# Patient Record
Sex: Female | Born: 1950
Health system: Southern US, Community
[De-identification: ages and names within clinical notes are randomized; demographics above are authoritative.]

## PROBLEM LIST (undated history)

## (undated) DIAGNOSIS — E785 Hyperlipidemia, unspecified: Secondary | ICD-10-CM

## (undated) HISTORY — PX: EVALUATION UNDER ANESTHESIA WITH HEMORRHOIDECTOMY AND PROCTOSCOPY: SHX5625

## (undated) HISTORY — DX: Hyperlipidemia, unspecified: E78.5

## (undated) HISTORY — PX: COLONOSCOPY WITH PROPOFOL: SHX5780

---

## 2005-03-17 ENCOUNTER — Ambulatory Visit: Payer: Self-pay | Admitting: Obstetrics and Gynecology

## 2006-04-28 ENCOUNTER — Ambulatory Visit: Payer: Self-pay | Admitting: Obstetrics and Gynecology

## 2010-07-08 ENCOUNTER — Ambulatory Visit: Payer: Self-pay | Admitting: Family Medicine

## 2011-07-14 ENCOUNTER — Ambulatory Visit: Payer: Self-pay | Admitting: Family Medicine

## 2011-08-10 ENCOUNTER — Ambulatory Visit: Payer: Self-pay | Admitting: Unknown Physician Specialty

## 2011-08-10 LAB — HM COLONOSCOPY

## 2013-07-31 ENCOUNTER — Ambulatory Visit: Payer: Self-pay | Admitting: Family Medicine

## 2014-10-21 LAB — HEPATIC FUNCTION PANEL
ALK PHOS: 59 (ref 25–125)
ALT: 17 (ref 7–35)
AST: 25 (ref 13–35)
Bilirubin, Total: 0.4

## 2014-10-21 LAB — LIPID PANEL
CHOLESTEROL: 251 — AB (ref 0–200)
HDL: 74 — AB (ref 35–70)
LDL CALC: 163
TRIGLYCERIDES: 69 (ref 40–160)

## 2014-10-21 LAB — BASIC METABOLIC PANEL
BUN: 16 (ref 4–21)
CREATININE: 0.5 (ref 0.5–1.1)
Glucose: 109
Potassium: 4.8 (ref 3.4–5.3)
SODIUM: 143 (ref 137–147)

## 2014-10-21 LAB — TSH: TSH: 3.27 (ref 0.41–5.90)

## 2015-12-23 ENCOUNTER — Other Ambulatory Visit: Payer: Self-pay | Admitting: Family Medicine

## 2015-12-23 DIAGNOSIS — Z1231 Encounter for screening mammogram for malignant neoplasm of breast: Secondary | ICD-10-CM

## 2016-01-07 ENCOUNTER — Encounter: Payer: Self-pay | Admitting: Radiology

## 2016-01-07 ENCOUNTER — Ambulatory Visit
Admission: RE | Admit: 2016-01-07 | Discharge: 2016-01-07 | Disposition: A | Discharge status: Home / Self Care | Payer: PRIVATE HEALTH INSURANCE | Source: Ambulatory Visit | Attending: Family Medicine | Admitting: Family Medicine

## 2016-01-07 DIAGNOSIS — Z1231 Encounter for screening mammogram for malignant neoplasm of breast: Secondary | ICD-10-CM | POA: Insufficient documentation

## 2016-09-08 ENCOUNTER — Encounter: Payer: Self-pay | Admitting: Podiatry

## 2016-09-08 ENCOUNTER — Ambulatory Visit (INDEPENDENT_AMBULATORY_CARE_PROVIDER_SITE_OTHER): Payer: PRIVATE HEALTH INSURANCE | Admitting: Podiatry

## 2016-09-08 ENCOUNTER — Ambulatory Visit (INDEPENDENT_AMBULATORY_CARE_PROVIDER_SITE_OTHER): Payer: PRIVATE HEALTH INSURANCE

## 2016-09-08 VITALS — BP 139/90 | HR 62 | Resp 16

## 2016-09-08 DIAGNOSIS — Q828 Other specified congenital malformations of skin: Secondary | ICD-10-CM

## 2016-09-08 DIAGNOSIS — M898X9 Other specified disorders of bone, unspecified site: Secondary | ICD-10-CM

## 2016-09-08 DIAGNOSIS — E782 Mixed hyperlipidemia: Secondary | ICD-10-CM | POA: Insufficient documentation

## 2016-09-08 DIAGNOSIS — Z709 Sex counseling, unspecified: Secondary | ICD-10-CM | POA: Insufficient documentation

## 2016-09-08 DIAGNOSIS — M791 Myalgia, unspecified site: Secondary | ICD-10-CM | POA: Insufficient documentation

## 2016-09-08 DIAGNOSIS — M2041 Other hammer toe(s) (acquired), right foot: Secondary | ICD-10-CM

## 2016-09-08 DIAGNOSIS — M2042 Other hammer toe(s) (acquired), left foot: Secondary | ICD-10-CM

## 2016-09-08 DIAGNOSIS — E785 Hyperlipidemia, unspecified: Secondary | ICD-10-CM | POA: Insufficient documentation

## 2016-09-08 DIAGNOSIS — K6289 Other specified diseases of anus and rectum: Secondary | ICD-10-CM | POA: Insufficient documentation

## 2016-09-08 NOTE — Progress Notes (Signed)
   Subjective:    Patient ID: Amanda Love, female    DOB: November 26, 1950, 66 y.o.   MRN: [id number]  HPI: She presents today with chief complaint of pain to the fifth digit of the left foot. She states that she's having some tenderness with the digital the right foot as well as some changes about the fourth digits bilaterally. She states that she has a small sore on her fifth toe that is painful. Stasis then there for several weeks there is recently gotten worse.    Review of Systems  All other systems reviewed and are negative.      Objective:   Physical Exam: I'll signs are stable alert and oriented 3. Pulses are palpable. Neurologic sensorium is intact. Deep tendon reflexes are intact. Muscle strength is 5 over 5 dorsiflexion plantar flexors and inverters everters all intrinsic musculature is intact bilateral. Orthopedic evaluation is resolved with assistive ankle for range of motion or crepitation. She has adductovarus rotated hammertoe deformities #4 #5 in particular bilaterally. Radiographs taken today do demonstrate adductovarus rotated hammertoe deformity #4 #5 of the left foot. No open lesions or wounds that she does demonstrate an overlying reactive hyperkeratosis medial aspect of the fifth DIPJ.        Assessment & Plan:  Porokeratosis with hammertoe deformities fifth left.  Plan: I debrided the lesion for her today and discussed surgical intervention regarding toes #4 #5 bilaterally. We will follow-up with her in the fall for this procedure. Also apply Cantharone under occlusion to be washed out thoroughly tomorrow as a chemical destruction. She will watch Faithlynn symptoms of infection notify me if there are any.

## 2017-03-29 LAB — HEPATIC FUNCTION PANEL
ALK PHOS: 62 (ref 25–125)
ALT: 28 (ref 7–35)
AST: 27 (ref 13–35)
Bilirubin, Total: 0.4

## 2017-03-29 LAB — BASIC METABOLIC PANEL
BUN: 12 (ref 4–21)
Creatinine: 0.6 (ref 0.5–1.1)
Glucose: 91
Potassium: 4.3 (ref 3.4–5.3)
Sodium: 142 (ref 137–147)

## 2017-03-29 LAB — CBC AND DIFFERENTIAL
HCT: 39 (ref 36–46)
HEMOGLOBIN: 12.9 (ref 12.0–16.0)
NEUTROS ABS: 33
PLATELETS: 294 (ref 150–399)
WBC: 3

## 2017-03-29 LAB — LIPID PANEL
CHOLESTEROL: 267 — AB (ref 0–200)
HDL: 65 (ref 35–70)
LDL CALC: 186
TRIGLYCERIDES: 80 (ref 40–160)

## 2017-03-29 LAB — HEMOGLOBIN A1C: HEMOGLOBIN A1C: 5.4

## 2017-03-29 LAB — TSH: TSH: 3.57 (ref 0.41–5.90)

## 2017-03-29 LAB — HM HEPATITIS C SCREENING LAB: HM Hepatitis Screen: NEGATIVE

## 2017-04-12 ENCOUNTER — Encounter: Payer: Self-pay | Admitting: Family Medicine

## 2017-04-12 ENCOUNTER — Ambulatory Visit (INDEPENDENT_AMBULATORY_CARE_PROVIDER_SITE_OTHER): Payer: PRIVATE HEALTH INSURANCE | Admitting: Family Medicine

## 2017-04-12 VITALS — BP 132/80 | HR 73 | Temp 97.9°F | Ht 63.0 in | Wt 120.6 lb

## 2017-04-12 DIAGNOSIS — E782 Mixed hyperlipidemia: Secondary | ICD-10-CM

## 2017-04-12 MED ORDER — PITAVASTATIN CALCIUM 2 MG PO TABS: 2.0000 mg | ORAL_TABLET | Freq: Every day | ORAL | 0 refills | Status: DC

## 2017-04-12 NOTE — Progress Notes (Signed)
Patient: Amanda Love Female    DOB: 1950-08-12   66 y.o.   MRN: [id number] Visit Date: 04/12/2017  Today's Provider: Vernie Murders, PA   Chief Complaint  Patient presents with  . Hyperlipidemia  . Follow-up   Subjective:    HPI  Lipid/Cholesterol, Follow-up:   Last seen for this over 2 years ago.  Management changes since that visit include patient taking Zetia. . Last Lipid Panel: Lab Results  Component Value Date   CHOL 267 (A) 03/29/2017   CHOL 251 (A) 10/21/2014   Lab Results  Component Value Date   HDL 65 03/29/2017   HDL 74 (A) 10/21/2014   Lab Results  Component Value Date   LDLCALC 186 03/29/2017   LDLCALC 163 10/21/2014   Lab Results  Component Value Date   TRIG 80 03/29/2017   TRIG 69 10/21/2014   She reports poor compliance with treatment. Patient states she can not take Lipitor or Zetia due to muscle aches. She is not having side effects.  Current symptoms include none  Weight trend: stable   Wt Readings from Last 3 Encounters:  04/12/17 120 lb 9.6 oz (54.7 kg)    -------------------------------------------------------------------  History reviewed. No pertinent past medical history.  History reviewed. No pertinent surgical history.  History reviewed. No pertinent family history.   No Known Allergies  Current Outpatient Medications:  .  ASCORBIC ACID PO, Take by mouth., Disp: , Rfl:  .  ASPIRIN 81 PO, Take by mouth., Disp: , Rfl:  .  CALCIUM CARBONATE-VITAMIN D PO, Take by mouth., Disp: , Rfl:  .  CYANOCOBALAMIN PO, Take by mouth., Disp: , Rfl:  .  ezetimibe (ZETIA) 10 MG tablet, Take by mouth., Disp: , Rfl:   Review of Systems  Constitutional: Negative.   Respiratory: Negative.   Cardiovascular: Negative.   Musculoskeletal: Negative.    Social History   Tobacco Use  . Smoking status: Never Smoker  . Smokeless tobacco: Never Used  Substance Use Topics  . Alcohol use: No   Objective:   BP 132/80 (BP Location: Right Arm,  Patient Position: Sitting, Cuff Size: Normal)   Pulse 73   Temp 97.9 F (36.6 C) (Oral)   Ht 5\' 3"  (1.6 m)   Wt 120 lb 9.6 oz (54.7 kg)   SpO2 97%   BMI 21.36 kg/m    Physical Exam  Constitutional: She is oriented to person, place, and time. She appears well-developed and well-nourished. No distress.  HENT:  Head: Normocephalic and atraumatic.  Right Ear: Hearing normal.  Left Ear: Hearing normal.  Nose: Nose normal.  Eyes: Conjunctivae and lids are normal. Right eye exhibits no discharge. Left eye exhibits no discharge. No scleral icterus.  Neck: Neck supple.  Cardiovascular: Normal rate and regular rhythm.  Pulmonary/Chest: Effort normal and breath sounds normal. No respiratory distress.  Abdominal: Soft. Bowel sounds are normal.  Musculoskeletal: Normal range of motion.  Neurological: She is alert and oriented to person, place, and time.  Skin: Skin is intact. No lesion and no rash noted.  Psychiatric: She has a normal mood and affect. Her speech is normal and behavior is normal. Thought content normal.      Assessment & Plan:     1. Mixed hyperlipidemia States she stopped the Zetia after using it for a month because of muscle aches. Trying to keep fats low in diet. On 03-29-17, lipid test at [facility] LDL was 186, HDL 65 and total cholesterol 267. Will give trial of  Livalo 2 mg qd. Take Co-Q 10 qd with the Livalo and call if any side effects in 2 weeks. May need to consider Welchol if unable to take this. - Pitavastatin Calcium 2 MG TABS; Take 1 tablet (2 mg total) daily after supper by mouth.  Dispense: 14 tablet; Refill: [place], PA  [facility]

## 2017-04-25 ENCOUNTER — Other Ambulatory Visit: Payer: Self-pay

## 2017-04-25 DIAGNOSIS — E782 Mixed hyperlipidemia: Secondary | ICD-10-CM

## 2017-04-25 MED ORDER — PITAVASTATIN CALCIUM 2 MG PO TABS: 2.0000 mg | ORAL_TABLET | Freq: Every day | ORAL | 3 refills | Status: DC

## 2017-04-25 NOTE — Telephone Encounter (Signed)
Pitavastatin 2 mg refill sent to [facility] per Simona Huh. Patient states she is only having minimal muscle aches with medication and can deal with the aches at this time. She states she will call back if symptoms worsen. 3 month follow up scheduled.

## 2017-04-27 ENCOUNTER — Telehealth: Payer: Self-pay | Admitting: Family Medicine

## 2017-04-27 NOTE — Telephone Encounter (Signed)
Pt states the Rx Pitavastatin Calcium 2 MG TABS  is going to cost $379.00 a month.  Pt is stating this is too expensive and pt is requesting something different.  [place]  JK#[phone]

## 2017-04-27 NOTE — Telephone Encounter (Signed)
PA submitted through cover my meds on 04/26/17 waiting on response.

## 2017-05-05 NOTE — Telephone Encounter (Signed)
Patient advised. Patient agrees to try Crestor. Pharmacy-[facility]

## 2017-05-05 NOTE — Telephone Encounter (Signed)
LMTCB, PA for Livalo denied because patient has not tried Crestor 40 mg and Lipitor 80 mg. Wanted to se if patient wants to switch to Crestor 40 mg.

## 2017-05-06 ENCOUNTER — Other Ambulatory Visit: Payer: Self-pay | Admitting: Family Medicine

## 2017-05-06 ENCOUNTER — Telehealth: Payer: Self-pay | Admitting: Family Medicine

## 2017-05-06 DIAGNOSIS — E782 Mixed hyperlipidemia: Secondary | ICD-10-CM

## 2017-05-06 MED ORDER — ROSUVASTATIN CALCIUM 5 MG PO TABS: 5.0000 mg | ORAL_TABLET | Freq: Every day | ORAL | 3 refills | Status: DC

## 2017-05-06 MED ORDER — EZETIMIBE 10 MG PO TABS: 10.0000 mg | ORAL_TABLET | Freq: Every day | ORAL | 3 refills | Status: DC

## 2017-05-06 NOTE — Telephone Encounter (Signed)
Please advise 

## 2017-05-06 NOTE — Telephone Encounter (Signed)
Done

## 2017-05-06 NOTE — Telephone Encounter (Signed)
Will start at a lower dose instead of at maximum level to be sure she tolerates this. Sent to the [facility].

## 2017-05-06 NOTE — Telephone Encounter (Signed)
Pt stated that she was advised her insurance will not cover rosuvastatin (CRESTOR) 5 MG tablet and pt is requesting an new Rx for ezetimibe (ZETIA) 10 MG tablet to [facility]. Please advise. Thanks TNP

## 2017-05-06 NOTE — Telephone Encounter (Signed)
Patient advised RX would be sent today to [facility]. She will call back if she has any side effects.

## 2017-06-01 ENCOUNTER — Telehealth: Payer: Self-pay | Admitting: Family Medicine

## 2017-06-01 NOTE — Telephone Encounter (Signed)
Patient states that her insurance has changed as on 05/31/2017 and will now cover the generic Crestor (Rosuvastatin).  They also gave her the name lovastatin that they will cover.  She would like to switch back to this medication.  Please call patient and let her know.  She uses [facility].

## 2017-06-02 ENCOUNTER — Other Ambulatory Visit: Payer: Self-pay | Admitting: Family Medicine

## 2017-06-02 DIAGNOSIS — E782 Mixed hyperlipidemia: Secondary | ICD-10-CM

## 2017-06-02 MED ORDER — ROSUVASTATIN CALCIUM 5 MG PO TABS: 5.0000 mg | ORAL_TABLET | Freq: Every day | ORAL | 1 refills | Status: DC

## 2017-06-02 NOTE — Telephone Encounter (Signed)
Will send in prescription for 5 mg (generic) qd to [facility]. Should recheck labs, to assess response, in 3 months.

## 2017-06-02 NOTE — Telephone Encounter (Signed)
Which - lovastatin or rosuvastatin?

## 2017-06-02 NOTE — Telephone Encounter (Signed)
Patient states she now has Medicare and would like the Crestor 5 mg resent to [facility]. She contacted Medicare and they will pay for medication.

## 2017-06-02 NOTE — Telephone Encounter (Signed)
Patient advised. Follow up scheduled.  

## 2017-06-02 NOTE — Progress Notes (Signed)
Change in health insurance to medicare. Now has coverage for the Crestor. Will send in prescription for 5 mg (generic) qd to [facility]. Should recheck labs, to assess response, in 3 months.

## 2017-06-03 DIAGNOSIS — Z961 Presence of intraocular lens: Secondary | ICD-10-CM | POA: Diagnosis not present

## 2017-06-28 DIAGNOSIS — M9904 Segmental and somatic dysfunction of sacral region: Secondary | ICD-10-CM | POA: Diagnosis not present

## 2017-06-28 DIAGNOSIS — M9903 Segmental and somatic dysfunction of lumbar region: Secondary | ICD-10-CM | POA: Diagnosis not present

## 2017-06-28 DIAGNOSIS — M545 Low back pain: Secondary | ICD-10-CM | POA: Diagnosis not present

## 2017-06-28 DIAGNOSIS — M7918 Myalgia, other site: Secondary | ICD-10-CM | POA: Diagnosis not present

## 2017-07-01 DIAGNOSIS — M9903 Segmental and somatic dysfunction of lumbar region: Secondary | ICD-10-CM | POA: Diagnosis not present

## 2017-07-01 DIAGNOSIS — M9904 Segmental and somatic dysfunction of sacral region: Secondary | ICD-10-CM | POA: Diagnosis not present

## 2017-07-01 DIAGNOSIS — M7918 Myalgia, other site: Secondary | ICD-10-CM | POA: Diagnosis not present

## 2017-07-01 DIAGNOSIS — M545 Low back pain: Secondary | ICD-10-CM | POA: Diagnosis not present

## 2017-07-05 DIAGNOSIS — M9903 Segmental and somatic dysfunction of lumbar region: Secondary | ICD-10-CM | POA: Diagnosis not present

## 2017-07-05 DIAGNOSIS — M9904 Segmental and somatic dysfunction of sacral region: Secondary | ICD-10-CM | POA: Diagnosis not present

## 2017-07-05 DIAGNOSIS — M7918 Myalgia, other site: Secondary | ICD-10-CM | POA: Diagnosis not present

## 2017-07-05 DIAGNOSIS — M545 Low back pain: Secondary | ICD-10-CM | POA: Diagnosis not present

## 2017-07-26 ENCOUNTER — Ambulatory Visit: Payer: PRIVATE HEALTH INSURANCE | Admitting: Family Medicine

## 2017-08-30 ENCOUNTER — Encounter: Payer: Self-pay | Admitting: Family Medicine

## 2017-08-30 ENCOUNTER — Ambulatory Visit (INDEPENDENT_AMBULATORY_CARE_PROVIDER_SITE_OTHER): Payer: PPO | Admitting: Family Medicine

## 2017-08-30 ENCOUNTER — Ambulatory Visit: Payer: PPO | Admitting: Family Medicine

## 2017-08-30 VITALS — BP 112/66 | HR 71 | Temp 97.7°F | Wt 120.8 lb

## 2017-08-30 DIAGNOSIS — M65332 Trigger finger, left middle finger: Secondary | ICD-10-CM | POA: Diagnosis not present

## 2017-08-30 DIAGNOSIS — E782 Mixed hyperlipidemia: Secondary | ICD-10-CM

## 2017-08-30 NOTE — Progress Notes (Signed)
     Patient: Amanda Love Female    DOB: 09-May-1951   67 y.o.   MRN: [id number] Visit Date: 08/30/2017  Today's Provider: Vernie Murders, PA   Chief Complaint  Patient presents with  . Hyperlipidemia  . Follow-up   Subjective:    HPI  Lipid/Cholesterol, Follow-up:   Last seen for this 5 months ago Management changes since that visit include patient started Crestor. Her insurance changed and medication was then covered.  Last Lipid Panel: Lab Results  Component Value Date   CHOL 267 (A) 03/29/2017   HDL 65 03/29/2017   LDLCALC 186 03/29/2017   TRIG 80 03/29/2017   She reports good compliance with treatment.  She is not having side effects.  Current symptoms include: none  Weight trend: stable  Wt Readings from Last 3 Encounters:  08/30/17 120 lb 12.8 oz (54.8 kg)  04/12/17 120 lb 9.6 oz (54.7 kg)   No Known Allergies  Current Outpatient Medications:  .  ASCORBIC ACID PO, Take by mouth., Disp: , Rfl:  .  ASPIRIN 81 PO, Take by mouth., Disp: , Rfl:  .  CALCIUM CARBONATE-VITAMIN D PO, Take by mouth., Disp: , Rfl:  .  CYANOCOBALAMIN PO, Take by mouth., Disp: , Rfl:  .  rosuvastatin (CRESTOR) 5 MG tablet, Take 1 tablet (5 mg total) by mouth daily., Disp: 90 tablet, Rfl: 1  Review of Systems  Constitutional: Negative.   Respiratory: Negative.   Cardiovascular: Negative.     Social History   Tobacco Use  . Smoking status: Never Smoker  . Smokeless tobacco: Never Used  Substance Use Topics  . Alcohol use: No   Objective:   BP 112/66 (BP Location: Right Arm, Patient Position: Sitting, Cuff Size: Normal)   Pulse 71   Temp 97.7 F (36.5 C) (Oral)   Wt 120 lb 12.8 oz (54.8 kg)   SpO2 99%   BMI 21.40 kg/m   Physical Exam  Constitutional: She is oriented to person, place, and time. She appears well-developed and well-nourished. No distress.  HENT:  Head: Normocephalic and atraumatic.  Right Ear: Hearing normal.  Left Ear: Hearing normal.  Nose: Nose  normal.  Eyes: Conjunctivae and lids are normal. Right eye exhibits no discharge. Left eye exhibits no discharge. No scleral icterus.  Neck: Neck supple.  Cardiovascular: Normal rate.  Pulmonary/Chest: Effort normal and breath sounds normal. No respiratory distress.  Abdominal: Soft. Bowel sounds are normal.  Musculoskeletal: Normal range of motion.  Neurological: She is alert and oriented to person, place, and time.  Skin: Skin is intact. No lesion and no rash noted.  Psychiatric: She has a normal mood and affect. Her speech is normal and behavior is normal. Thought content normal.      Assessment & Plan:     1. Mixed hyperlipidemia Tolerating Crestor 5 mg qd without side effects. Following a high fiber low fat diet. Continue exercise program 3-4 days a week. Recheck labs and follow up pending reports. - Lipid Profile - Comprehensive metabolic panel       Vernie Murders, PA  [facility]

## 2017-08-31 LAB — COMPREHENSIVE METABOLIC PANEL
A/G RATIO: 1.8 (ref 1.2–2.2)
ALT: 24 IU/L (ref 0–32)
AST: 26 IU/L (ref 0–40)
Albumin: 4.8 g/dL (ref 3.6–4.8)
Alkaline Phosphatase: 68 IU/L (ref 39–117)
BUN/Creatinine Ratio: 29 — ABNORMAL HIGH (ref 12–28)
BUN: 17 mg/dL (ref 8–27)
Bilirubin Total: 0.5 mg/dL (ref 0.0–1.2)
CALCIUM: 9.7 mg/dL (ref 8.7–10.3)
CO2: 24 mmol/L (ref 20–29)
Chloride: 102 mmol/L (ref 96–106)
Creatinine, Ser: 0.59 mg/dL (ref 0.57–1.00)
GFR, EST AFRICAN AMERICAN: 109 mL/min/{1.73_m2} (ref >59)
GFR, EST NON AFRICAN AMERICAN: 94 mL/min/{1.73_m2} (ref >59)
GLUCOSE: 91 mg/dL (ref 65–99)
Globulin, Total: 2.7 g/dL (ref 1.5–4.5)
Potassium: 4.3 mmol/L (ref 3.5–5.2)
Sodium: 141 mmol/L (ref 134–144)
TOTAL PROTEIN: 7.5 g/dL (ref 6.0–8.5)

## 2017-08-31 LAB — LIPID PANEL
CHOL/HDL RATIO: 3.1 ratio (ref 0.0–4.4)
Cholesterol, Total: 202 mg/dL — ABNORMAL HIGH (ref 100–199)
HDL: 65 mg/dL (ref >39)
LDL Calculated: 130 mg/dL — ABNORMAL HIGH (ref 0–99)
TRIGLYCERIDES: 33 mg/dL (ref 0–149)
VLDL Cholesterol Cal: 7 mg/dL (ref 5–40)

## 2017-09-01 ENCOUNTER — Ambulatory Visit: Payer: PPO | Admitting: Family Medicine

## 2017-09-02 ENCOUNTER — Telehealth: Payer: Self-pay

## 2017-09-02 NOTE — Telephone Encounter (Signed)
-----   Message from Margo Common, [place] sent at 09/01/2017 12:55 PM EDT ----- LDL cholesterol improving on the Crestor (rosuvastatin) 5 mg qd. Continue this dosage and recheck levels in 6 months. All blood chemistry (kidney function, liver function, blood sugar, etc.) normal.

## 2017-09-02 NOTE — Telephone Encounter (Signed)
LMTCB

## 2017-09-06 ENCOUNTER — Other Ambulatory Visit: Payer: Self-pay | Admitting: Family Medicine

## 2017-09-06 DIAGNOSIS — E782 Mixed hyperlipidemia: Secondary | ICD-10-CM

## 2017-09-06 MED ORDER — ROSUVASTATIN CALCIUM 5 MG PO TABS: 5.0000 mg | ORAL_TABLET | Freq: Every day | ORAL | 3 refills | Status: DC

## 2017-09-06 NOTE — Telephone Encounter (Signed)
Advised patient. Patient requesting a refill on Crestor 5mg  to be sent for 90 day supply at [facility] on [address]. Thanks!

## 2017-09-06 NOTE — Telephone Encounter (Signed)
Done

## 2017-09-27 DIAGNOSIS — M65332 Trigger finger, left middle finger: Secondary | ICD-10-CM | POA: Diagnosis not present

## 2017-12-06 DIAGNOSIS — M65332 Trigger finger, left middle finger: Secondary | ICD-10-CM | POA: Diagnosis not present

## 2017-12-16 DIAGNOSIS — M65332 Trigger finger, left middle finger: Secondary | ICD-10-CM | POA: Diagnosis not present

## 2018-04-14 ENCOUNTER — Ambulatory Visit
Admission: RE | Admit: 2018-04-14 | Discharge: 2018-04-14 | Disposition: A | Discharge status: Home / Self Care | Payer: PPO | Source: Ambulatory Visit | Attending: Family Medicine | Admitting: Family Medicine

## 2018-04-14 ENCOUNTER — Other Ambulatory Visit: Payer: Self-pay | Admitting: Family Medicine

## 2018-04-14 DIAGNOSIS — Z1231 Encounter for screening mammogram for malignant neoplasm of breast: Secondary | ICD-10-CM | POA: Diagnosis not present

## 2018-05-02 NOTE — Progress Notes (Signed)
       Patient: Amanda Love Female    DOB: [birth year]   67 y.o.   MRN: [id number] Visit Date: 05/03/2018  Today's Provider: Trinna Post, PA-C   Chief Complaint  Patient presents with  . Cough   Subjective:    Cough Patient presents today for cough for about 1 week . Patient denies any other symptoms at this time. She reports she has a dry cough that gets worse at night. She denies fevers, chills, sick contacts. Denies muscle aches. She does not smoke.      No Known Allergies   Current Outpatient Medications:  .  ASCORBIC ACID PO, Take by mouth., Disp: , Rfl:  .  ASPIRIN 81 PO, Take by mouth., Disp: , Rfl:  .  CALCIUM CARBONATE-VITAMIN D PO, Take by mouth., Disp: , Rfl:  .  CYANOCOBALAMIN PO, Take by mouth., Disp: , Rfl:  .  rosuvastatin (CRESTOR) 5 MG tablet, Take 1 tablet (5 mg total) by mouth daily., Disp: 90 tablet, Rfl: 3  Review of Systems  Respiratory: Positive for cough.     Social History   Tobacco Use  . Smoking status: Never Smoker  . Smokeless tobacco: Never Used  Substance Use Topics  . Alcohol use: No   Objective:   BP 116/72 (BP Location: Left Arm, Patient Position: Sitting, Cuff Size: Normal)   Pulse 74   Temp 98.1 F (36.7 C) (Oral)   Wt 122 lb 6.4 oz (55.5 kg)   SpO2 95%   BMI 21.68 kg/m  Vitals:   05/03/18 0954  BP: 116/72  Pulse: 74  Temp: 98.1 F (36.7 C)  TempSrc: Oral  SpO2: 95%  Weight: 122 lb 6.4 oz (55.5 kg)     Physical Exam  Constitutional: She is oriented to person, place, and time. She appears well-developed and well-nourished.  HENT:  Right Ear: Tympanic membrane, external ear and ear canal normal.  Left Ear: Tympanic membrane, external ear and ear canal normal.  Mouth/Throat: Uvula is midline and oropharynx is clear and moist. No oropharyngeal exudate.  Cardiovascular: Normal rate and regular rhythm.  Pulmonary/Chest: Effort normal and breath sounds normal.  Neurological: She is alert and oriented to person, place,  and time.  Skin: Skin is warm and dry.  Psychiatric: She has a normal mood and affect. Her behavior is normal.        Assessment & Plan:     1. Viral URI with cough  Counseled regarding signs and symptoms of viral and bacterial respiratory infections. Advised to call or return for additional evaluation if she develops any sign of bacterial infection, or if current symptoms last longer than 10 days.   - promethazine-dextromethorphan (PROMETHAZINE-DM) 6.25-15 MG/5ML syrup; Take 5 mLs by mouth 2 (two) times daily as needed for cough.  Dispense: 118 mL; Refill: 0  The entirety of the information documented in the History of Present Illness, Review of Systems and Physical Exam were personally obtained by me. Portions of this information were initially documented by Joseline ROsas, CMA and reviewed by me for thoroughness and accuracy.            Trinna Post, PA-C  [facility]

## 2018-05-03 ENCOUNTER — Encounter: Payer: Self-pay | Admitting: Physician Assistant

## 2018-05-03 ENCOUNTER — Ambulatory Visit (INDEPENDENT_AMBULATORY_CARE_PROVIDER_SITE_OTHER): Payer: PPO | Admitting: Physician Assistant

## 2018-05-03 VITALS — BP 116/72 | HR 74 | Temp 98.1°F | Wt 122.4 lb

## 2018-05-03 DIAGNOSIS — B9789 Other viral agents as the cause of diseases classified elsewhere: Secondary | ICD-10-CM

## 2018-05-03 DIAGNOSIS — J069 Acute upper respiratory infection, unspecified: Secondary | ICD-10-CM | POA: Diagnosis not present

## 2018-05-03 MED ORDER — PROMETHAZINE-DM 6.25-15 MG/5ML PO SYRP: 5.0000 mL | ORAL_SOLUTION | Freq: Two times a day (BID) | ORAL | 0 refills | Status: DC | PRN

## 2018-05-03 NOTE — Patient Instructions (Signed)
Upper Respiratory Infection, Adult Most upper respiratory infections (URIs) are caused by a virus. A URI affects the nose, throat, and upper air passages. The most common type of URI is often called "the common cold." Follow these instructions at home:  Take medicines only as told by your doctor.  Gargle warm saltwater or take cough drops to comfort your throat as told by your doctor.  Use a warm mist humidifier or inhale steam from a shower to increase air moisture. This may make it easier to breathe.  Drink enough fluid to keep your pee (urine) clear or pale yellow.  Eat soups and other clear broths.  Have a healthy diet.  Rest as needed.  Go back to work when your fever is gone or your doctor says it is okay. ? You may need to stay home longer to avoid giving your URI to others. ? You can also wear a face mask and wash your hands often to prevent spread of the virus.  Use your inhaler more if you have asthma.  Do not use any tobacco products, including cigarettes, chewing tobacco, or electronic cigarettes. If you need help quitting, ask your doctor. Contact a doctor if:  You are getting worse, not better.  Your symptoms are not helped by medicine.  You have chills.  You are getting more short of breath.  You have brown or red mucus.  You have yellow or brown discharge from your nose.  You have pain in your face, especially when you bend forward.  You have a fever.  You have puffy (swollen) neck glands.  You have pain while swallowing.  You have white areas in the back of your throat. Get help right away if:  You have very bad or constant: ? Headache. ? Ear pain. ? Pain in your forehead, behind your eyes, and over your cheekbones (sinus pain). ? Chest pain.  You have long-lasting (chronic) lung disease and any of the following: ? Wheezing. ? Long-lasting cough. ? Coughing up blood. ? A change in your usual mucus.  You have a stiff neck.  You have  changes in your: ? Vision. ? Hearing. ? Thinking. ? Mood. This information is not intended to replace advice given to you by your health care provider. Make sure you discuss any questions you have with your health care provider. Document Released: 11/03/2007 Document Revised: 01/18/2016 Document Reviewed: 08/22/2013 Elsevier Interactive Patient Education  2018 Elsevier Inc.  

## 2018-08-24 ENCOUNTER — Other Ambulatory Visit: Payer: Self-pay | Admitting: *Deleted

## 2018-08-24 DIAGNOSIS — E782 Mixed hyperlipidemia: Secondary | ICD-10-CM

## 2018-08-24 MED ORDER — ROSUVASTATIN CALCIUM 5 MG PO TABS: 5.0000 mg | ORAL_TABLET | Freq: Every day | ORAL | 0 refills | Status: DC

## 2018-08-28 ENCOUNTER — Ambulatory Visit: Payer: PPO | Admitting: Family Medicine

## 2018-10-26 ENCOUNTER — Other Ambulatory Visit: Payer: Self-pay

## 2018-10-26 ENCOUNTER — Encounter: Payer: Self-pay | Admitting: Family Medicine

## 2018-10-26 ENCOUNTER — Ambulatory Visit (INDEPENDENT_AMBULATORY_CARE_PROVIDER_SITE_OTHER): Payer: Medicare Other | Admitting: Family Medicine

## 2018-10-26 VITALS — BP 118/72 | HR 73 | Temp 98.1°F | Resp 16 | Wt 122.0 lb

## 2018-10-26 DIAGNOSIS — E782 Mixed hyperlipidemia: Secondary | ICD-10-CM

## 2018-10-26 DIAGNOSIS — Z719 Counseling, unspecified: Secondary | ICD-10-CM | POA: Diagnosis not present

## 2018-10-26 MED ORDER — ROSUVASTATIN CALCIUM 5 MG PO TABS: 5.0000 mg | ORAL_TABLET | Freq: Every day | ORAL | 3 refills | Status: DC

## 2018-10-26 NOTE — Progress Notes (Signed)
Patient: Amanda Love Female    DOB: Oct 28, 1950   68 y.o.   MRN: [id number] Visit Date: 10/26/2018  Today's Provider: Vernie Murders, PA   Chief Complaint  Patient presents with  . Hyperlipidemia   Subjective:     HPI   3 Month follow up for hyperlipidemia.    No past surgical history on file.   Family History  Problem Relation Age of Onset  . Breast cancer Neg Hx    Patient Active Problem List   Diagnosis Date Noted  . Anal burning 09/08/2016  . Sex counseling 09/08/2016  . HLD (hyperlipidemia) 09/08/2016  . Muscle ache 09/08/2016   No Known Allergies  Current Outpatient Medications:  .  ASCORBIC ACID PO, Take by mouth., Disp: , Rfl:  .  ASPIRIN 81 PO, Take by mouth., Disp: , Rfl:  .  CALCIUM CARBONATE-VITAMIN D PO, Take by mouth., Disp: , Rfl:  .  promethazine-dextromethorphan (PROMETHAZINE-DM) 6.25-15 MG/5ML syrup, Take 5 mLs by mouth 2 (two) times daily as needed for cough., Disp: 118 mL, Rfl: 0 .  rosuvastatin (CRESTOR) 5 MG tablet, Take 1 tablet (5 mg total) by mouth daily., Disp: 90 tablet, Rfl: 0 .  CYANOCOBALAMIN PO, Take by mouth., Disp: , Rfl:   Review of Systems  All other systems reviewed and are negative.  Social History   Tobacco Use  . Smoking status: Never Smoker  . Smokeless tobacco: Never Used  Substance Use Topics  . Alcohol use: No     Objective:   BP 118/72 (BP Location: Right Arm, Patient Position: Sitting, Cuff Size: Normal)   Pulse 73   Temp 98.1 F (36.7 C) (Oral)   Resp 16   Wt 122 lb (55.3 kg)   SpO2 97%   BMI 21.61 kg/m    Wt Readings from Last 3 Encounters:  10/26/18 122 lb (55.3 kg)  05/03/18 122 lb 6.4 oz (55.5 kg)  08/30/17 120 lb 12.8 oz (54.8 kg)    Vitals:   10/26/18 0947  BP: 118/72  Pulse: 73  Resp: 16  Temp: 98.1 F (36.7 C)  TempSrc: Oral  SpO2: 97%  Weight: 122 lb (55.3 kg)   Physical Exam Constitutional:      General: She is not in acute distress.    Appearance: She is well-developed.   HENT:     Head: Normocephalic and atraumatic.     Right Ear: Hearing normal.     Left Ear: Hearing normal.     Nose: Nose normal.     Mouth/Throat:     Pharynx: Oropharynx is clear.  Eyes:     General: Lids are normal. No scleral icterus.       Right eye: No discharge.        Left eye: No discharge.     Conjunctiva/sclera: Conjunctivae normal.  Neck:     Musculoskeletal: Neck supple.  Cardiovascular:     Rate and Rhythm: Normal rate and regular rhythm.     Pulses: Normal pulses.     Heart sounds: Normal heart sounds.  Pulmonary:     Effort: Pulmonary effort is normal. No respiratory distress.     Breath sounds: Normal breath sounds.  Abdominal:     General: Bowel sounds are normal.     Palpations: Abdomen is soft.  Musculoskeletal: Normal range of motion.  Skin:    Findings: No lesion or rash.  Neurological:     Mental Status: She is alert and oriented to person,  place, and time.  Psychiatric:        Speech: Speech normal.        Behavior: Behavior normal.        Thought Content: Thought content normal.       Assessment & Plan    1. Mixed hyperlipidemia Stable weight. Tolerating Crestor 5 mg qd with low fat diet and regular exercise. No muscle or joint pains. Will refill Crestor and get follow up labs. - CBC with Differential/Platelet - Comprehensive metabolic panel - Lipid panel - TSH - rosuvastatin (CRESTOR) 5 MG tablet; Take 1 tablet (5 mg total) by mouth daily.  Dispense: 90 tablet; Refill: 3  2. Counseling, unspecified Counseled regarding need for tetanus and pneumonia vaccination, AWE, etc. Last colonoscopy in 2013 and mammograms 04-17-18 were normal. Refuses immunizations or other screenings today. Discussed husband's poor health after a stroke and his very poor compliance with his treatment regimen. 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 No willing to seek psychological counseling.     Vernie Murders, PA  [facility]

## 2018-10-27 ENCOUNTER — Telehealth: Payer: Self-pay

## 2018-10-27 ENCOUNTER — Other Ambulatory Visit: Payer: Self-pay | Admitting: Family Medicine

## 2018-10-27 DIAGNOSIS — E782 Mixed hyperlipidemia: Secondary | ICD-10-CM

## 2018-10-27 LAB — COMPREHENSIVE METABOLIC PANEL
ALT: 29 IU/L (ref 0–32)
AST: 27 IU/L (ref 0–40)
Albumin/Globulin Ratio: 1.8 (ref 1.2–2.2)
Albumin: 4.8 g/dL (ref 3.8–4.8)
Alkaline Phosphatase: 61 IU/L (ref 39–117)
BUN/Creatinine Ratio: 23 (ref 12–28)
BUN: 13 mg/dL (ref 8–27)
Bilirubin Total: 0.5 mg/dL (ref 0.0–1.2)
CO2: 26 mmol/L (ref 20–29)
Calcium: 9.5 mg/dL (ref 8.7–10.3)
Chloride: 102 mmol/L (ref 96–106)
Creatinine, Ser: 0.57 mg/dL (ref 0.57–1.00)
GFR calc Af Amer: 109 mL/min/{1.73_m2} (ref >59)
GFR calc non Af Amer: 95 mL/min/{1.73_m2} (ref >59)
Globulin, Total: 2.6 g/dL (ref 1.5–4.5)
Glucose: 96 mg/dL (ref 65–99)
Potassium: 4.2 mmol/L (ref 3.5–5.2)
Sodium: 140 mmol/L (ref 134–144)
Total Protein: 7.4 g/dL (ref 6.0–8.5)

## 2018-10-27 LAB — CBC WITH DIFFERENTIAL/PLATELET
Basophils Absolute: 0 10*3/uL (ref 0.0–0.2)
Basos: 1 %
EOS (ABSOLUTE): 0 10*3/uL (ref 0.0–0.4)
Eos: 1 %
Hematocrit: 38.9 % (ref 34.0–46.6)
Hemoglobin: 12.7 g/dL (ref 11.1–15.9)
Immature Grans (Abs): 0 10*3/uL (ref 0.0–0.1)
Immature Granulocytes: 0 %
Lymphocytes Absolute: 1.6 10*3/uL (ref 0.7–3.1)
Lymphs: 52 %
MCH: 31.8 pg (ref 26.6–33.0)
MCHC: 32.6 g/dL (ref 31.5–35.7)
MCV: 97 fL (ref 79–97)
Monocytes Absolute: 0.2 10*3/uL (ref 0.1–0.9)
Monocytes: 8 %
Neutrophils Absolute: 1.2 10*3/uL — ABNORMAL LOW (ref 1.4–7.0)
Neutrophils: 38 %
Platelets: 264 10*3/uL (ref 150–450)
RBC: 4 x10E6/uL (ref 3.77–5.28)
RDW: 12.2 % (ref 11.7–15.4)
WBC: 3 10*3/uL — ABNORMAL LOW (ref 3.4–10.8)

## 2018-10-27 LAB — LIPID PANEL
Chol/HDL Ratio: 3.3 ratio (ref 0.0–4.4)
Cholesterol, Total: 208 mg/dL — ABNORMAL HIGH (ref 100–199)
HDL: 63 mg/dL (ref >39)
LDL Calculated: 131 mg/dL — ABNORMAL HIGH (ref 0–99)
Triglycerides: 71 mg/dL (ref 0–149)
VLDL Cholesterol Cal: 14 mg/dL (ref 5–40)

## 2018-10-27 LAB — TSH: TSH: 2.11 u[IU]/mL (ref 0.450–4.500)

## 2018-10-27 MED ORDER — ROSUVASTATIN CALCIUM 10 MG PO TABS: 10.0000 mg | ORAL_TABLET | Freq: Every day | ORAL | 3 refills | Status: DC

## 2018-10-27 NOTE — Telephone Encounter (Signed)
-----   Message from Margo Common, [place] sent at 10/27/2018  8:58 AM EDT ----- Cholesterol levels still as high as last year. Recommend increasing Crestor to 10 mg qd and recheck levels in 4-6 months.

## 2018-10-27 NOTE — Telephone Encounter (Signed)
Done

## 2018-10-27 NOTE — Telephone Encounter (Signed)
Patient advised and is agreeable she would like new prescription sent to [facility]. KW

## 2018-12-08 ENCOUNTER — Telehealth: Payer: Self-pay | Admitting: Family Medicine

## 2018-12-08 DIAGNOSIS — E782 Mixed hyperlipidemia: Secondary | ICD-10-CM

## 2018-12-08 MED ORDER — ROSUVASTATIN CALCIUM 5 MG PO TABS: 5.0000 mg | ORAL_TABLET | Freq: Every day | ORAL | 3 refills | Status: DC

## 2018-12-08 NOTE — Telephone Encounter (Signed)
Medication sent into the pharmacy. 

## 2018-12-08 NOTE — Telephone Encounter (Signed)
Please review. Thanks!  

## 2018-12-08 NOTE — Telephone Encounter (Signed)
May decrease to 5 mg daily and add (OTC) Co-Q 10 200 mg qd to help alleviate muscle issues. This should help with cholesterol levels and may allow her to use more of the Crestor without muscle problems.

## 2018-12-08 NOTE — Telephone Encounter (Signed)
Advised patient as below. She is requesting that we send in crestor 5mg  into the pharmacy. Ok to send? [facility].

## 2018-12-08 NOTE — Telephone Encounter (Signed)
Pt is taking rosuvastatin (CRESTOR) 10 MG tablet  She was increased from 5mg  to 10 mg Since she's been taking the 10 mg she is having difficulty with walking, like leg muscles not working correctly, making it hard to walk well. Last night she started back taking 5 mg. Today she is not having any issues.  Please call pt back to discuss if she needs to go back to taking the 5mg .  Thanks, [employer]

## 2018-12-08 NOTE — Telephone Encounter (Signed)
Yes, please send 

## 2018-12-08 NOTE — Telephone Encounter (Signed)
Left message to call back  

## 2019-02-09 DIAGNOSIS — M62830 Muscle spasm of back: Secondary | ICD-10-CM | POA: Diagnosis not present

## 2019-02-09 DIAGNOSIS — M5412 Radiculopathy, cervical region: Secondary | ICD-10-CM | POA: Diagnosis not present

## 2019-02-09 DIAGNOSIS — M9902 Segmental and somatic dysfunction of thoracic region: Secondary | ICD-10-CM | POA: Diagnosis not present

## 2019-02-09 DIAGNOSIS — M9901 Segmental and somatic dysfunction of cervical region: Secondary | ICD-10-CM | POA: Diagnosis not present

## 2019-02-12 DIAGNOSIS — M5412 Radiculopathy, cervical region: Secondary | ICD-10-CM | POA: Diagnosis not present

## 2019-02-12 DIAGNOSIS — M9902 Segmental and somatic dysfunction of thoracic region: Secondary | ICD-10-CM | POA: Diagnosis not present

## 2019-02-12 DIAGNOSIS — M9901 Segmental and somatic dysfunction of cervical region: Secondary | ICD-10-CM | POA: Diagnosis not present

## 2019-02-12 DIAGNOSIS — M62830 Muscle spasm of back: Secondary | ICD-10-CM | POA: Diagnosis not present

## 2019-02-14 DIAGNOSIS — M9902 Segmental and somatic dysfunction of thoracic region: Secondary | ICD-10-CM | POA: Diagnosis not present

## 2019-02-14 DIAGNOSIS — M9901 Segmental and somatic dysfunction of cervical region: Secondary | ICD-10-CM | POA: Diagnosis not present

## 2019-02-14 DIAGNOSIS — M5412 Radiculopathy, cervical region: Secondary | ICD-10-CM | POA: Diagnosis not present

## 2019-02-14 DIAGNOSIS — M62830 Muscle spasm of back: Secondary | ICD-10-CM | POA: Diagnosis not present

## 2019-02-15 DIAGNOSIS — M9902 Segmental and somatic dysfunction of thoracic region: Secondary | ICD-10-CM | POA: Diagnosis not present

## 2019-02-15 DIAGNOSIS — M5412 Radiculopathy, cervical region: Secondary | ICD-10-CM | POA: Diagnosis not present

## 2019-02-15 DIAGNOSIS — M62830 Muscle spasm of back: Secondary | ICD-10-CM | POA: Diagnosis not present

## 2019-02-15 DIAGNOSIS — Z961 Presence of intraocular lens: Secondary | ICD-10-CM | POA: Diagnosis not present

## 2019-02-15 DIAGNOSIS — M9901 Segmental and somatic dysfunction of cervical region: Secondary | ICD-10-CM | POA: Diagnosis not present

## 2019-02-16 DIAGNOSIS — Z23 Encounter for immunization: Secondary | ICD-10-CM | POA: Diagnosis not present

## 2019-02-19 DIAGNOSIS — M62830 Muscle spasm of back: Secondary | ICD-10-CM | POA: Diagnosis not present

## 2019-02-19 DIAGNOSIS — M9902 Segmental and somatic dysfunction of thoracic region: Secondary | ICD-10-CM | POA: Diagnosis not present

## 2019-02-19 DIAGNOSIS — M9901 Segmental and somatic dysfunction of cervical region: Secondary | ICD-10-CM | POA: Diagnosis not present

## 2019-02-19 DIAGNOSIS — M5412 Radiculopathy, cervical region: Secondary | ICD-10-CM | POA: Diagnosis not present

## 2019-02-22 DIAGNOSIS — M5412 Radiculopathy, cervical region: Secondary | ICD-10-CM | POA: Diagnosis not present

## 2019-02-22 DIAGNOSIS — M62830 Muscle spasm of back: Secondary | ICD-10-CM | POA: Diagnosis not present

## 2019-02-22 DIAGNOSIS — M9902 Segmental and somatic dysfunction of thoracic region: Secondary | ICD-10-CM | POA: Diagnosis not present

## 2019-02-22 DIAGNOSIS — M9901 Segmental and somatic dysfunction of cervical region: Secondary | ICD-10-CM | POA: Diagnosis not present

## 2019-02-26 DIAGNOSIS — M5412 Radiculopathy, cervical region: Secondary | ICD-10-CM | POA: Diagnosis not present

## 2019-02-26 DIAGNOSIS — M62830 Muscle spasm of back: Secondary | ICD-10-CM | POA: Diagnosis not present

## 2019-02-26 DIAGNOSIS — M9902 Segmental and somatic dysfunction of thoracic region: Secondary | ICD-10-CM | POA: Diagnosis not present

## 2019-02-26 DIAGNOSIS — M9901 Segmental and somatic dysfunction of cervical region: Secondary | ICD-10-CM | POA: Diagnosis not present

## 2019-03-01 DIAGNOSIS — M62830 Muscle spasm of back: Secondary | ICD-10-CM | POA: Diagnosis not present

## 2019-03-01 DIAGNOSIS — M5412 Radiculopathy, cervical region: Secondary | ICD-10-CM | POA: Diagnosis not present

## 2019-03-01 DIAGNOSIS — M9902 Segmental and somatic dysfunction of thoracic region: Secondary | ICD-10-CM | POA: Diagnosis not present

## 2019-03-01 DIAGNOSIS — M9901 Segmental and somatic dysfunction of cervical region: Secondary | ICD-10-CM | POA: Diagnosis not present

## 2019-03-05 DIAGNOSIS — M5412 Radiculopathy, cervical region: Secondary | ICD-10-CM | POA: Diagnosis not present

## 2019-03-05 DIAGNOSIS — M9901 Segmental and somatic dysfunction of cervical region: Secondary | ICD-10-CM | POA: Diagnosis not present

## 2019-03-05 DIAGNOSIS — M62830 Muscle spasm of back: Secondary | ICD-10-CM | POA: Diagnosis not present

## 2019-03-05 DIAGNOSIS — M9902 Segmental and somatic dysfunction of thoracic region: Secondary | ICD-10-CM | POA: Diagnosis not present

## 2019-03-08 ENCOUNTER — Other Ambulatory Visit: Payer: Self-pay | Admitting: Family Medicine

## 2019-03-08 DIAGNOSIS — M5412 Radiculopathy, cervical region: Secondary | ICD-10-CM | POA: Diagnosis not present

## 2019-03-08 DIAGNOSIS — M9902 Segmental and somatic dysfunction of thoracic region: Secondary | ICD-10-CM | POA: Diagnosis not present

## 2019-03-08 DIAGNOSIS — M62830 Muscle spasm of back: Secondary | ICD-10-CM | POA: Diagnosis not present

## 2019-03-08 DIAGNOSIS — M9901 Segmental and somatic dysfunction of cervical region: Secondary | ICD-10-CM | POA: Diagnosis not present

## 2019-03-08 DIAGNOSIS — Z1231 Encounter for screening mammogram for malignant neoplasm of breast: Secondary | ICD-10-CM

## 2019-03-12 DIAGNOSIS — M9902 Segmental and somatic dysfunction of thoracic region: Secondary | ICD-10-CM | POA: Diagnosis not present

## 2019-03-12 DIAGNOSIS — M5412 Radiculopathy, cervical region: Secondary | ICD-10-CM | POA: Diagnosis not present

## 2019-03-12 DIAGNOSIS — M62830 Muscle spasm of back: Secondary | ICD-10-CM | POA: Diagnosis not present

## 2019-03-12 DIAGNOSIS — M9901 Segmental and somatic dysfunction of cervical region: Secondary | ICD-10-CM | POA: Diagnosis not present

## 2019-03-19 DIAGNOSIS — M62830 Muscle spasm of back: Secondary | ICD-10-CM | POA: Diagnosis not present

## 2019-03-19 DIAGNOSIS — M9901 Segmental and somatic dysfunction of cervical region: Secondary | ICD-10-CM | POA: Diagnosis not present

## 2019-03-19 DIAGNOSIS — M5412 Radiculopathy, cervical region: Secondary | ICD-10-CM | POA: Diagnosis not present

## 2019-03-19 DIAGNOSIS — M9902 Segmental and somatic dysfunction of thoracic region: Secondary | ICD-10-CM | POA: Diagnosis not present

## 2019-03-26 DIAGNOSIS — M9901 Segmental and somatic dysfunction of cervical region: Secondary | ICD-10-CM | POA: Diagnosis not present

## 2019-03-26 DIAGNOSIS — M62830 Muscle spasm of back: Secondary | ICD-10-CM | POA: Diagnosis not present

## 2019-03-26 DIAGNOSIS — M5412 Radiculopathy, cervical region: Secondary | ICD-10-CM | POA: Diagnosis not present

## 2019-03-26 DIAGNOSIS — M9902 Segmental and somatic dysfunction of thoracic region: Secondary | ICD-10-CM | POA: Diagnosis not present

## 2019-04-09 DIAGNOSIS — M9901 Segmental and somatic dysfunction of cervical region: Secondary | ICD-10-CM | POA: Diagnosis not present

## 2019-04-09 DIAGNOSIS — M9902 Segmental and somatic dysfunction of thoracic region: Secondary | ICD-10-CM | POA: Diagnosis not present

## 2019-04-09 DIAGNOSIS — M62830 Muscle spasm of back: Secondary | ICD-10-CM | POA: Diagnosis not present

## 2019-04-09 DIAGNOSIS — M5412 Radiculopathy, cervical region: Secondary | ICD-10-CM | POA: Diagnosis not present

## 2019-04-17 ENCOUNTER — Ambulatory Visit
Admission: RE | Admit: 2019-04-17 | Discharge: 2019-04-17 | Disposition: A | Discharge status: Home / Self Care | Payer: Medicare Other | Source: Ambulatory Visit | Attending: Family Medicine | Admitting: Family Medicine

## 2019-04-17 DIAGNOSIS — Z1231 Encounter for screening mammogram for malignant neoplasm of breast: Secondary | ICD-10-CM | POA: Insufficient documentation

## 2019-04-19 ENCOUNTER — Ambulatory Visit: Payer: Medicare Other

## 2019-05-01 NOTE — Progress Notes (Addendum)
Subjective:   Amanda Love is a 68 y.o. female who presents for an Initial Medicare Annual Wellness Visit.    This visit is being conducted through telemedicine due to the COVID-19 pandemic. This patient has given me verbal consent via doximity to conduct this visit, patient states they are participating from their home address. Some vital signs may be absent or patient reported.    Patient identification: identified by name, DOB, and current address  Review of Systems    N/A   Cardiac Risk Factors include: advanced age (>59men, >17 women);dyslipidemia     Objective:    Today's Vitals   05/02/19 1047  PainSc: 0-No pain   There is no height or weight on file to calculate BMI. Unable to obtain vitals due to visit being conducted via telephonically.   Advanced Directives 05/02/2019  Does Patient Have a Medical Advance Directive? No  Would patient like information on creating a medical advance directive? No - Patient declined    Current Medications (verified) Outpatient Encounter Medications as of 05/02/2019  Medication Sig  . ASCORBIC ACID PO Take 500 mg by mouth 2 (two) times daily.   . ASPIRIN 81 PO Take by mouth daily.   . Calcium Carbonate-Vit D-Min (CALTRATE 600+D PLUS PO) Take by mouth 2 (two) times daily.  . cholecalciferol (VITAMIN D) 25 MCG (1000 UT) tablet Take 1,000 Units by mouth daily.  . Coenzyme Q10 10 MG capsule Take 10 mg by mouth daily.  . Menaquinone-7 (VITAMIN K2) 100 MCG CAPS Take by mouth daily.  . Misc Natural Products (OSTEO BI-FLEX ADV TRIPLE ST PO) Take by mouth daily.  . Multiple Vitamin (MULTIVITAMIN) tablet Take 1 tablet by mouth daily.  . Probiotic Product ([employer] ADV PROBIOTIC GUMMIES PO) Take by mouth daily.  . rosuvastatin (CRESTOR) 5 MG tablet Take 1 tablet (5 mg total) by mouth daily.  . SUPER B COMPLEX/C PO Take by mouth daily.  . Turmeric 500 MG TABS Take by mouth daily.  Marland Kitchen CALCIUM CARBONATE-VITAMIN D PO Take by mouth.  . CYANOCOBALAMIN PO  Take by mouth daily.   . promethazine-dextromethorphan (PROMETHAZINE-DM) 6.25-15 MG/5ML syrup Take 5 mLs by mouth 2 (two) times daily as needed for cough. (Patient not taking: Reported on 05/02/2019)   No facility-administered encounter medications on file as of 05/02/2019.     Allergies (verified) Patient has no known allergies.   History: History reviewed. No pertinent past medical history. History reviewed. No pertinent surgical history. Family History  Problem Relation Age of Onset  . Breast cancer Neg Hx    Social History   Socioeconomic History  . Marital status: Married    Spouse name: Not on file  . Number of children: 1  . Years of education: Not on file  . Highest education level: Some college, no degree  Occupational History  . Not on file  Social Needs  . Financial resource strain: Not hard at all  . Food insecurity    Worry: Never true    Inability: Never true  . Transportation needs    Medical: No    Non-medical: No  Tobacco Use  . Smoking status: Never Smoker  . Smokeless tobacco: Never Used  Substance and Sexual Activity  . Alcohol use: Yes    Comment: occasionally 1 glass of red wine  . Drug use: Not on file  . Sexual activity: Not on file  Lifestyle  . Physical activity    Days per week: 2 days    Minutes  per session: 60 min  . Stress: Not at all  Relationships  . Social Herbalist on phone: Patient refused    Gets together: Patient refused    Attends religious service: Patient refused    Active member of club or organization: Patient refused    Attends meetings of clubs or organizations: Patient refused    Relationship status: Patient refused  Other Topics Concern  . Not on file  Social History Narrative  . Not on file    Tobacco Counseling Counseling given: Not Answered   Clinical Intake:  Pre-visit preparation completed: Yes  Pain : No/denies pain Pain Score: 0-No pain     Nutritional Risks: None Diabetes: No   How often do you need to have someone help you when you read instructions, pamphlets, or other written materials from your doctor or pharmacy?: 1 - Never  Interpreter Needed?: No  Information entered by :: [facility], LPN   Activities of Daily Living In your present state of health, do you have any difficulty performing the following activities: 05/02/2019  Hearing? N  Vision? N  Difficulty concentrating or making decisions? N  Walking or climbing stairs? N  Dressing or bathing? N  Doing errands, shopping? N  Preparing Food and eating ? N  Using the Toilet? N  In the past six months, have you accidently leaked urine? N  Do you have problems with loss of bowel control? N  Managing your Medications? N  Managing your Finances? N  Housekeeping or managing your Housekeeping? N  Some recent data might be hidden     Immunizations and Health Maintenance Immunization History  Administered Date(s) Administered  . Influenza, High Dose Seasonal PF 03/28/2018, 02/16/2019  . Influenza-Unspecified 04/06/2017  . Pneumococcal Conjugate-13 03/28/2018   Health Maintenance Due  Topic Date Due  . DEXA SCAN  02/12/2014  . PNA vac Low Risk Adult (2 of 2 - PPSV23) 03/29/2019    Patient Care Team: Chrismon, Vickki Muff, PA as PCP - General (Family Medicine)  Indicate any recent Medical Services you may have received from other than [facility] providers in the past year (date may be approximate).     Assessment:   This is a routine wellness examination for Amanda Love.  Hearing/Vision screen No exam data present  Dietary issues and exercise activities discussed: Current Exercise Habits: Home exercise routine, Type of exercise: treadmill;walking, Time (Minutes): 60, Frequency (Times/Week): 2, Weekly Exercise (Minutes/Week): 120, Intensity: Mild, Exercise limited by: None identified  Goals   None    Depression Screen PHQ 2/9 Scores 05/02/2019 10/26/2018 04/12/2017  PHQ - 2 Score 0 0 0    Fall Risk  Fall Risk  05/02/2019 04/12/2017  Falls in the past year? 0 No  Number falls in past yr: 0 -  Injury with Fall? 0 -   FALL RISK PREVENTION PERTAINING TO THE HOME:  Any stairs in or around the home? Yes  If so, are there any without handrails? No   Home free of loose throw rugs in walkways, pet beds, electrical cords, etc? Yes  Adequate lighting in your home to reduce risk of falls? Yes   ASSISTIVE DEVICES UTILIZED TO PREVENT FALLS:  Life alert? No  Use of a cane, walker or w/c? No  Grab bars in the bathroom? Yes  Shower chair or bench in shower? No  Elevated toilet seat or a handicapped toilet? No    TIMED UP AND GO:  Was the test performed? No .  Cognitive Function: Declined today.         Screening Tests Health Maintenance  Topic Date Due  . DEXA SCAN  02/12/2014  . PNA vac Low Risk Adult (2 of 2 - PPSV23) 03/29/2019  . TETANUS/TDAP  05/01/2020 (Originally 02/13/1970)  . MAMMOGRAM  04/16/2021  . COLONOSCOPY  08/09/2021  . INFLUENZA VACCINE  Completed  . Hepatitis C Screening  Completed    Qualifies for Shingles Vaccine? Yes . Due for Shingrix. Pt has been advised to call insurance company to determine out of pocket expense. Advised may also receive vaccine at local pharmacy or Health Dept. Verbalized acceptance and understanding.  Tdap: Although this vaccine is not a covered service during a Wellness Exam, does the patient still wish to receive this vaccine today?  No .   Flu Vaccine: Up to date  Pneumococcal Vaccine: Due for Pneumococcal vaccine. Does the patient want to receive this vaccine today?  No .   Cancer Screenings:  Colorectal Screening: Completed 08/10/11. Repeat every 10 years.   Mammogram: Completed 04/17/19.   Bone Density: Currently due- pt declined order today.   Lung Cancer Screening: (Low Dose CT Chest recommended if Age 50-80 years, 30 pack-year currently smoking OR have quit w/in 15years.) does not qualify.   Additional Screening:   Hepatitis C Screening: Up to date  Vision Screening: Recommended annual ophthalmology exams for early detection of glaucoma and other disorders of the eye.  Dental Screening: Recommended annual dental exams for proper oral hygiene  Community Resource Referral:  CRR required this visit?  No       Plan:  I have personally reviewed and addressed the Medicare Annual Wellness questionnaire and have noted the following in the patient's chart:  A. Medical and social history B. Use of alcohol, tobacco or illicit drugs  C. Current medications and supplements D. Functional ability and status E.  Nutritional status F.  Physical activity G. Advance directives H. List of other physicians I.  Hospitalizations, surgeries, and ER visits in previous 12 months J.  Amanda Love such as hearing and vision if needed, cognitive and depression L. Referrals and appointments   In addition, I have reviewed and discussed with patient certain preventive protocols, quality metrics, and best practice recommendations. A written personalized care plan for preventive services as well as general preventive health recommendations were provided to patient.   [place]ora Score, [place]   [id number]  Nurse Health Advisor   Nurse Notes: Pt declined a DEXA referral today. Pt to check with pharmacy about if she received a pneumonia vaccine previously.   Reviewed Nurse Health Advisor note and plan. Agree with documentation and recommendations.

## 2019-05-02 ENCOUNTER — Other Ambulatory Visit: Payer: Self-pay

## 2019-05-02 ENCOUNTER — Ambulatory Visit (INDEPENDENT_AMBULATORY_CARE_PROVIDER_SITE_OTHER): Payer: Medicare Other

## 2019-05-02 DIAGNOSIS — Z0000 Encounter for general adult medical examination without abnormal findings: Secondary | ICD-10-CM | POA: Diagnosis not present

## 2019-05-02 NOTE — Patient Instructions (Signed)
Amanda Love , Thank you for taking time to come for your Medicare Wellness Visit. I appreciate your ongoing commitment to your health goals. Please review the following plan we discussed and let me know if I can assist you in the future.   Screening recommendations/referrals: Colonoscopy: Up to date, due 07/2021 Mammogram: Up to date, due 03/2021 Bone Density: Currently due, pt declined the DEXA referral today.  Recommended yearly ophthalmology/optometry visit for glaucoma screening and checkup Recommended yearly dental visit for hygiene and checkup  Vaccinations: Influenza vaccine: Up to date Pneumococcal vaccine: Pneumovax 23 due  Tdap vaccine: Pt declines today.  Shingles vaccine: Pt declines today.     Advanced directives: Advance directive discussed with you today. Even though you declined this today please call our office should you change your mind and we can give you the proper paperwork for you to fill out.  Conditions/risks identified: None.   Next appointment: None, declined scheduling a follow up with PCP or an AWV for 2021 at this time.    Preventive Care 33 Years and Older, Female Preventive care refers to lifestyle choices and visits with your health care provider that can promote health and wellness. What does preventive care include?  A yearly physical exam. This is also called an annual well check.  Dental exams once or twice a year.  Routine eye exams. Ask your health care provider how often you should have your eyes checked.  Personal lifestyle choices, including:  Daily care of your teeth and gums.  Regular physical activity.  Eating a healthy diet.  Avoiding tobacco and drug use.  Limiting alcohol use.  Practicing safe sex.  Taking low-dose aspirin every day.  Taking vitamin and mineral supplements as recommended by your health care provider. What happens during an annual well check? The services and screenings done by your health care provider  during your annual well check will depend on your age, overall health, lifestyle risk factors, and family history of disease. Counseling  Your health care provider may ask you questions about your:  Alcohol use.  Tobacco use.  Drug use.  Emotional well-being.  Home and relationship well-being.  Sexual activity.  Eating habits.  History of falls.  Memory and ability to understand (cognition).  Work and work Statistician.  Reproductive health. Screening  You may have the following tests or measurements:  Height, weight, and BMI.  Blood pressure.  Lipid and cholesterol levels. These may be checked every 5 years, or more frequently if you are over 52 years old.  Skin check.  Lung cancer screening. You may have this screening every year starting at age 26 if you have a 30-pack-year history of smoking and currently smoke or have quit within the past 15 years.  Fecal occult blood test (FOBT) of the stool. You may have this test every year starting at age 24.  Flexible sigmoidoscopy or colonoscopy. You may have a sigmoidoscopy every 5 years or a colonoscopy every 10 years starting at age 75.  Hepatitis C blood test.  Hepatitis B blood test.  Sexually transmitted disease (STD) testing.  Diabetes screening. This is done by checking your blood sugar (glucose) after you have not eaten for a while (fasting). You may have this done every 1-3 years.  Bone density scan. This is done to screen for osteoporosis. You may have this done starting at age 48.  Mammogram. This may be done every 1-2 years. Talk to your health care provider about how often you should have  regular mammograms. Talk with your health care provider about your test results, treatment options, and if necessary, the need for more tests. Vaccines  Your health care provider may recommend certain vaccines, such as:  Influenza vaccine. This is recommended every year.  Tetanus, diphtheria, and acellular pertussis  (Tdap, Td) vaccine. You may need a Td booster every 10 years.  Zoster vaccine. You may need this after age 30.  Pneumococcal 13-valent conjugate (PCV13) vaccine. One dose is recommended after age 41.  Pneumococcal polysaccharide (PPSV23) vaccine. One dose is recommended after age 77. Talk to your health care provider about which screenings and vaccines you need and how often you need them. This information is not intended to replace advice given to you by your health care provider. Make sure you discuss any questions you have with your health care provider. Document Released: 06/13/2015 Document Revised: 02/04/2016 Document Reviewed: 03/18/2015 Elsevier Interactive Patient Education  2017 [employer] Prevention in the Home Falls can cause injuries. They can happen to people of all ages. There are many things you can do to make your home safe and to help prevent falls. What can I do on the outside of my home?  Regularly fix the edges of walkways and driveways and fix any cracks.  Remove anything that might make you trip as you walk through a door, such as a raised step or threshold.  Trim any bushes or trees on the path to your home.  Use bright outdoor lighting.  Clear any walking paths of anything that might make someone trip, such as rocks or tools.  Regularly check to see if handrails are loose or broken. Make sure that both sides of any steps have handrails.  Any raised decks and porches should have guardrails on the edges.  Have any leaves, snow, or ice cleared regularly.  Use sand or salt on walking paths during winter.  Clean up any spills in your garage right away. This includes oil or grease spills. What can I do in the bathroom?  Use night lights.  Install grab bars by the toilet and in the tub and shower. Do not use towel bars as grab bars.  Use non-skid mats or decals in the tub or shower.  If you need to sit down in the shower, use a plastic, non-slip  stool.  Keep the floor dry. Clean up any water that spills on the floor as soon as it happens.  Remove soap buildup in the tub or shower regularly.  Attach bath mats securely with double-sided non-slip rug tape.  Do not have throw rugs and other things on the floor that can make you trip. What can I do in the bedroom?  Use night lights.  Make sure that you have a light by your bed that is easy to reach.  Do not use any sheets or blankets that are too big for your bed. They should not hang down onto the floor.  Have a firm chair that has side arms. You can use this for support while you get dressed.  Do not have throw rugs and other things on the floor that can make you trip. What can I do in the kitchen?  Clean up any spills right away.  Avoid walking on wet floors.  Keep items that you use a lot in easy-to-reach places.  If you need to reach something above you, use a strong step stool that has a grab bar.  Keep electrical cords out of the  way.  Do not use floor polish or wax that makes floors slippery. If you must use wax, use non-skid floor wax.  Do not have throw rugs and other things on the floor that can make you trip. What can I do with my stairs?  Do not leave any items on the stairs.  Make sure that there are handrails on both sides of the stairs and use them. Fix handrails that are broken or loose. Make sure that handrails are as long as the stairways.  Check any carpeting to make sure that it is firmly attached to the stairs. Fix any carpet that is loose or worn.  Avoid having throw rugs at the top or bottom of the stairs. If you do have throw rugs, attach them to the floor with carpet tape.  Make sure that you have a light switch at the top of the stairs and the bottom of the stairs. If you do not have them, ask someone to add them for you. What else can I do to help prevent falls?  Wear shoes that:  Do not have high heels.  Have rubber bottoms.  Are  comfortable and fit you well.  Are closed at the toe. Do not wear sandals.  If you use a stepladder:  Make sure that it is fully opened. Do not climb a closed stepladder.  Make sure that both sides of the stepladder are locked into place.  Ask someone to hold it for you, if possible.  Clearly mark and make sure that you can see:  Any grab bars or handrails.  First and last steps.  Where the edge of each step is.  Use tools that help you move around (mobility aids) if they are needed. These include:  Canes.  Walkers.  Scooters.  Crutches.  Turn on the lights when you go into a dark area. Replace any light bulbs as soon as they burn out.  Set up your furniture so you have a clear path. Avoid moving your furniture around.  If any of your floors are uneven, fix them.  If there are any pets around you, be aware of where they are.  Review your medicines with your doctor. Some medicines can make you feel dizzy. This can increase your chance of falling. Ask your doctor what other things that you can do to help prevent falls. This information is not intended to replace advice given to you by your health care provider. Make sure you discuss any questions you have with your health care provider. Document Released: 03/13/2009 Document Revised: 10/23/2015 Document Reviewed: 06/21/2014 Elsevier Interactive Patient Education  2017 [employer].

## 2019-05-07 DIAGNOSIS — M5412 Radiculopathy, cervical region: Secondary | ICD-10-CM | POA: Diagnosis not present

## 2019-05-07 DIAGNOSIS — M9901 Segmental and somatic dysfunction of cervical region: Secondary | ICD-10-CM | POA: Diagnosis not present

## 2019-05-07 DIAGNOSIS — M62830 Muscle spasm of back: Secondary | ICD-10-CM | POA: Diagnosis not present

## 2019-05-07 DIAGNOSIS — M9902 Segmental and somatic dysfunction of thoracic region: Secondary | ICD-10-CM | POA: Diagnosis not present

## 2019-06-04 DIAGNOSIS — M9901 Segmental and somatic dysfunction of cervical region: Secondary | ICD-10-CM | POA: Diagnosis not present

## 2019-06-04 DIAGNOSIS — M62830 Muscle spasm of back: Secondary | ICD-10-CM | POA: Diagnosis not present

## 2019-06-04 DIAGNOSIS — M5412 Radiculopathy, cervical region: Secondary | ICD-10-CM | POA: Diagnosis not present

## 2019-06-04 DIAGNOSIS — M9902 Segmental and somatic dysfunction of thoracic region: Secondary | ICD-10-CM | POA: Diagnosis not present

## 2019-06-21 ENCOUNTER — Ambulatory Visit: Payer: Medicare Other | Attending: Internal Medicine

## 2019-06-21 DIAGNOSIS — Z23 Encounter for immunization: Secondary | ICD-10-CM | POA: Insufficient documentation

## 2019-06-21 NOTE — Progress Notes (Signed)
   Covid-19 Vaccination Clinic  Name:  Amanda Love    MRN: [id number] DOB: Jul 17, 1950  06/21/2019  [name] was observed post Covid-19 immunization for 15 minutes without incidence. She was provided with Vaccine Information Sheet and instruction to access the V-Safe system.   [name] was instructed to call 911 with any severe reactions post vaccine: Marland Kitchen Difficulty breathing  . Swelling of your face and throat  . A fast heartbeat  . A bad rash all over your body  . Dizziness and weakness    Immunizations Administered    Name Date Dose VIS Date Route   Pfizer COVID-19 Vaccine 06/21/2019 11:20 AM 0.3 mL 05/11/2019 Intramuscular   Manufacturer: [place]   Lot: [id number]   [place]: [id number]

## 2019-07-02 DIAGNOSIS — M62830 Muscle spasm of back: Secondary | ICD-10-CM | POA: Diagnosis not present

## 2019-07-02 DIAGNOSIS — M5412 Radiculopathy, cervical region: Secondary | ICD-10-CM | POA: Diagnosis not present

## 2019-07-02 DIAGNOSIS — M9901 Segmental and somatic dysfunction of cervical region: Secondary | ICD-10-CM | POA: Diagnosis not present

## 2019-07-02 DIAGNOSIS — M9902 Segmental and somatic dysfunction of thoracic region: Secondary | ICD-10-CM | POA: Diagnosis not present

## 2019-07-12 ENCOUNTER — Ambulatory Visit: Payer: Medicare Other | Attending: Internal Medicine

## 2019-07-12 DIAGNOSIS — Z23 Encounter for immunization: Secondary | ICD-10-CM | POA: Insufficient documentation

## 2019-07-12 NOTE — Progress Notes (Signed)
   Covid-19 Vaccination Clinic  Name:  Amanda Love    MRN: [id number] DOB: May 11, 1951  07/12/2019  Ms. Levingston was observed post Covid-19 immunization for 15 minutes without incidence. She was provided with Vaccine Information Sheet and instruction to access the V-Safe system.   Ms. Schechter was instructed to call 911 with any severe reactions post vaccine: Marland Kitchen Difficulty breathing  . Swelling of your face and throat  . A fast heartbeat  . A bad rash all over your body  . Dizziness and weakness    Immunizations Administered    Name Date Dose VIS Date Route   Pfizer COVID-19 Vaccine 07/12/2019 11:45 AM 0.3 mL 05/11/2019 Intramuscular   Manufacturer: [employer]   Lot: [id number]   [place]: [id number]

## 2019-07-30 DIAGNOSIS — M9902 Segmental and somatic dysfunction of thoracic region: Secondary | ICD-10-CM | POA: Diagnosis not present

## 2019-07-30 DIAGNOSIS — M5412 Radiculopathy, cervical region: Secondary | ICD-10-CM | POA: Diagnosis not present

## 2019-07-30 DIAGNOSIS — M62830 Muscle spasm of back: Secondary | ICD-10-CM | POA: Diagnosis not present

## 2019-07-30 DIAGNOSIS — M9901 Segmental and somatic dysfunction of cervical region: Secondary | ICD-10-CM | POA: Diagnosis not present

## 2019-09-03 DIAGNOSIS — M62830 Muscle spasm of back: Secondary | ICD-10-CM | POA: Diagnosis not present

## 2019-09-03 DIAGNOSIS — M5412 Radiculopathy, cervical region: Secondary | ICD-10-CM | POA: Diagnosis not present

## 2019-09-03 DIAGNOSIS — M9901 Segmental and somatic dysfunction of cervical region: Secondary | ICD-10-CM | POA: Diagnosis not present

## 2019-09-03 DIAGNOSIS — M9902 Segmental and somatic dysfunction of thoracic region: Secondary | ICD-10-CM | POA: Diagnosis not present

## 2019-10-01 DIAGNOSIS — M5412 Radiculopathy, cervical region: Secondary | ICD-10-CM | POA: Diagnosis not present

## 2019-10-01 DIAGNOSIS — M9902 Segmental and somatic dysfunction of thoracic region: Secondary | ICD-10-CM | POA: Diagnosis not present

## 2019-10-01 DIAGNOSIS — M62830 Muscle spasm of back: Secondary | ICD-10-CM | POA: Diagnosis not present

## 2019-10-01 DIAGNOSIS — M9901 Segmental and somatic dysfunction of cervical region: Secondary | ICD-10-CM | POA: Diagnosis not present

## 2019-11-05 DIAGNOSIS — M62830 Muscle spasm of back: Secondary | ICD-10-CM | POA: Diagnosis not present

## 2019-11-05 DIAGNOSIS — M9901 Segmental and somatic dysfunction of cervical region: Secondary | ICD-10-CM | POA: Diagnosis not present

## 2019-11-05 DIAGNOSIS — M9902 Segmental and somatic dysfunction of thoracic region: Secondary | ICD-10-CM | POA: Diagnosis not present

## 2019-11-05 DIAGNOSIS — M5412 Radiculopathy, cervical region: Secondary | ICD-10-CM | POA: Diagnosis not present

## 2019-12-10 ENCOUNTER — Other Ambulatory Visit: Payer: Self-pay

## 2019-12-10 ENCOUNTER — Ambulatory Visit (INDEPENDENT_AMBULATORY_CARE_PROVIDER_SITE_OTHER): Payer: Medicare Other | Admitting: Dermatology

## 2019-12-10 DIAGNOSIS — B079 Viral wart, unspecified: Secondary | ICD-10-CM | POA: Diagnosis not present

## 2019-12-10 DIAGNOSIS — L820 Inflamed seborrheic keratosis: Secondary | ICD-10-CM

## 2019-12-10 NOTE — Patient Instructions (Signed)

## 2019-12-10 NOTE — Progress Notes (Signed)
   New Patient Visit  Subjective  Amanda Love is a 69 y.o. female who presents for the following: Other (Spots of left eyebrow and right foot. One on eyebrow was much bigger a few weeks ago.).  The following portions of the chart were reviewed this encounter and updated as appropriate:  Tobacco  Allergies  Meds  Problems  Med Hx  Surg Hx  Fam Hx     Review of Systems:  No other skin or systemic complaints except as noted in HPI or Assessment and Plan.  Objective  Well appearing patient in no apparent distress; mood and affect are within normal limits.  A focused examination was performed including face, right foot. Relevant physical exam findings are noted in the Assessment and Plan.  Objective  Left lat brow (2): Erythematous keratotic or waxy stuck-on papule or plaque.   Objective  Right medial base of great toe: 0.7 cm verrucous papule -- Discussed viral etiology and contagion.    Assessment & Plan    Inflamed seborrheic keratosis (2) Left lat brow  Destruction of lesion - Left lat brow Complexity: simple   Destruction method: cryotherapy   Informed consent: discussed and consent obtained   Timeout:  patient name, date of birth, surgical site, and procedure verified Lesion destroyed using liquid nitrogen: Yes   Region frozen until ice ball extended beyond lesion: Yes   Outcome: patient tolerated procedure well with no complications   Post-procedure details: wound care instructions given    Viral warts, unspecified type Right medial base of great toe  May consider shave removal if not improving.  Destruction of lesion - Right medial base of great toe Complexity: simple   Destruction method: cryotherapy   Informed consent: discussed and consent obtained   Timeout:  patient name, date of birth, surgical site, and procedure verified Lesion destroyed using liquid nitrogen: Yes   Region frozen until ice ball extended beyond lesion: Yes   Outcome: patient  tolerated procedure well with no complications   Post-procedure details: wound care instructions given    Return in about 6 weeks (around 01/21/2020).   I, Ashok Cordia, CMA, am acting as scribe for Sarina Ser, MD .  Documentation: I have reviewed the above documentation for accuracy and completeness, and I agree with the above.  Sarina Ser, MD

## 2019-12-11 ENCOUNTER — Encounter: Payer: Self-pay | Admitting: Dermatology

## 2019-12-26 ENCOUNTER — Other Ambulatory Visit: Payer: Self-pay

## 2019-12-26 ENCOUNTER — Ambulatory Visit (INDEPENDENT_AMBULATORY_CARE_PROVIDER_SITE_OTHER): Payer: Medicare Other | Admitting: Adult Health

## 2019-12-26 ENCOUNTER — Encounter: Payer: Self-pay | Admitting: Adult Health

## 2019-12-26 VITALS — BP 124/82 | HR 71 | Temp 96.9°F | Resp 15 | Wt 120.6 lb

## 2019-12-26 DIAGNOSIS — K6289 Other specified diseases of anus and rectum: Secondary | ICD-10-CM | POA: Diagnosis not present

## 2019-12-26 DIAGNOSIS — E782 Mixed hyperlipidemia: Secondary | ICD-10-CM | POA: Diagnosis not present

## 2019-12-26 DIAGNOSIS — E559 Vitamin D deficiency, unspecified: Secondary | ICD-10-CM

## 2019-12-26 MED ORDER — HYDROCORTISONE (PERIANAL) 2.5 % EX CREA: 1.0000 "application " | TOPICAL_CREAM | Freq: Two times a day (BID) | CUTANEOUS | 0 refills | Status: DC

## 2019-12-26 MED ORDER — ROSUVASTATIN CALCIUM 5 MG PO TABS: 5.0000 mg | ORAL_TABLET | Freq: Every day | ORAL | 3 refills | Status: DC

## 2019-12-26 NOTE — Patient Instructions (Addendum)
High Cholesterol  High cholesterol is a condition in which the blood has high levels of a white, waxy, fat-like substance (cholesterol). The human body needs small amounts of cholesterol. The liver makes all the cholesterol that the body needs. Extra (excess) cholesterol comes from the food that we eat. Cholesterol is carried from the liver by the blood through the blood vessels. If you have high cholesterol, deposits (plaques) may build up on the walls of your blood vessels (arteries). Plaques make the arteries narrower and stiffer. Cholesterol plaques increase your risk for heart attack and stroke. Work with your health care provider to keep your cholesterol levels in a healthy range. What increases the risk? This condition is more likely to develop in people who:  Eat foods that are high in animal fat (saturated fat) or cholesterol.  Are overweight.  Are not getting enough exercise.  Have a family history of high cholesterol. What are the signs or symptoms? There are no symptoms of this condition. How is this diagnosed? This condition may be diagnosed from the results of a blood test.  If you are older than age 46, your health care provider may check your cholesterol every 4-6 years.  You may be checked more often if you already have high cholesterol or other risk factors for heart disease. The blood test for cholesterol measures:  "Bad" cholesterol (LDL cholesterol). This is the main type of cholesterol that causes heart disease. The desired level for LDL is less than 100.  "Good" cholesterol (HDL cholesterol). This type helps to protect against heart disease by cleaning the arteries and carrying the LDL away. The desired level for HDL is 60 or higher.  Triglycerides. These are fats that the body can store or burn for energy. The desired number for triglycerides is lower than 150.  Total cholesterol. This is a measure of the total amount of cholesterol in your blood, including LDL  cholesterol, HDL cholesterol, and triglycerides. A healthy number is less than 200. How is this treated? This condition is treated with diet changes, lifestyle changes, and medicines. Diet changes  This may include eating more whole grains, fruits, vegetables, nuts, and fish.  This may also include cutting back on red meat and foods that have a lot of added sugar. Lifestyle changes  Changes may include getting at least 40 minutes of aerobic exercise 3 times a week. Aerobic exercises include walking, biking, and swimming. Aerobic exercise along with a healthy diet can help you maintain a healthy weight.  Changes may also include quitting smoking. Medicines  Medicines are usually given if diet and lifestyle changes have failed to reduce your cholesterol to healthy levels.  Your health care provider may prescribe a statin medicine. Statin medicines have been shown to reduce cholesterol, which can reduce the risk of heart disease. Follow these instructions at home: Eating and drinking If told by your health care provider:  Eat chicken (without skin), fish, veal, shellfish, ground [place] breast, and round or loin cuts of red meat.  Do not eat fried foods or fatty meats, such as hot dogs and salami.  Eat plenty of fruits, such as apples.  Eat plenty of vegetables, such as broccoli, potatoes, and carrots.  Eat beans, peas, and lentils.  Eat grains such as barley, rice, couscous, and bulgur wheat.  Eat pasta without cream sauces.  Use skim or nonfat milk, and eat low-fat or nonfat yogurt and cheeses.  Do not eat or drink whole milk, cream, ice cream, egg yolks,  or hard cheeses.  Do not eat stick margarine or tub margarines that contain trans fats (also called partially hydrogenated oils).  Do not eat saturated tropical oils, such as coconut oil and palm oil.  Do not eat cakes, cookies, crackers, or other baked goods that contain trans fats.  General instructions  Exercise as  directed by your health care provider. Increase your activity level with activities such as gardening, walking, and taking the stairs.  Take over-the-counter and prescription medicines only as told by your health care provider.  Do not use any products that contain nicotine or tobacco, such as cigarettes and e-cigarettes. If you need help quitting, ask your health care provider.  Keep all follow-up visits as told by your health care provider. This is important. Contact a health care provider if:  You are struggling to maintain a healthy diet or weight.  You need help to start on an exercise program.  You need help to stop smoking. Get help right away if:  You have chest pain.  You have trouble breathing. This information is not intended to replace advice given to you by your health care provider. Make sure you discuss any questions you have with your health care provider. Document Revised: 05/20/2017 Document Reviewed: 11/15/2015 Elsevier Patient Education  [place]. Hydrocortisone rectal cream What is this medicine? HYDROCORTISONE (hye droe KOR ti sone) is a corticosteroid. It is used to decrease swelling, itching and pain that is caused by minor skin irritations or hemorrhoids. This medicine may be used for other purposes; ask your health care provider or pharmacist if you have questions. COMMON BRAND NAME(S): Anusol HC, Procto-Kit, Proctocort, Proctocream-HC, Proctosol-HC, Proctozone-HC What should I tell my health care provider before I take this medicine? They need to know if you have any of these conditions:  an unusual or allergic reaction to hydrocortisone, corticosteroids, other medicines, foods, dyes, or preservatives  pregnant or trying to get pregnant  breast-feeding How should I use this medicine? This medicine is for rectal use only. Do not take by mouth. Do not apply to your eye. Follow directions on the prescription label. Wash your hands before and after  use. Apply a thin film to the affected area. Do not use on healthy skin or over large areas of skin. Do not cover with a bandage or dressing unless your doctor or health care professional tells you to. If you are to cover the area, follow the instructions carefully. Covering the area can increase the amount that passes through the skin and increases the risk of side effects. Do not use your medicine more often than directed. It is important not to use more medicine than prescribed. Talk to your pediatrician regarding the use of this medicine in children. Special care may be needed. If applying this medicine to the diaper area of a child, do not cover with tight-fitting diapers or plastic pants. Overdosage: If you think you have taken too much of this medicine contact a poison control center or emergency room at once. NOTE: This medicine is only for you. Do not share this medicine with others. What if I miss a dose? If you miss a dose, use it as soon as you can. If it is almost time for your next dose, use only that dose. Do not use double or extra doses. What may interact with this medicine? Interactions are not expected. Do not use any other skin products on the affected area without telling your doctor or health care professional. This  list may not describe all possible interactions. Give your health care provider a list of all the medicines, herbs, non-prescription drugs, or dietary supplements you use. Also tell them if you smoke, drink alcohol, or use illegal drugs. Some items may interact with your medicine. What should I watch for while using this medicine? Tell your doctor or health care professional if your symptoms do not start to get better after a few days. If you get any type of infection while using this medicine, you may need to stop using this medicine until your infections clears up. Ask your doctor or health care professional for advice. What side effects may I notice from receiving  this medicine? Side effects that you should report to your doctor or health care professional as soon as possible:  burning or itching of the skin  dark red spots on the skin  infection  lack of healing of skin condition  painful, red, pus-filled blisters in hair follicles  thinning of the skin Side effects that usually do not require medical attention (report to your doctor or health care professional if they continue or are bothersome):  dry skin, irritation  unusual increased growth of hair on the face or body This list may not describe all possible side effects. Call your doctor for medical advice about side effects. You may report side effects to FDA at 1-800-FDA-1088. Where should I keep my medicine? Keep out of the reach of children. Store at room temperature between 20 and 25 degrees C (68 and 77 degrees F). Protect from heat and freezing. Throw away any unused medicine after the expiration date. NOTE: This sheet is a summary. It may not cover all possible information. If you have questions about this medicine, talk to your doctor, pharmacist, or health care provider.  2020 Elsevier/Gold Standard (2007-10-02 14:28:03) Rosuvastatin Tablets What is this medicine? ROSUVASTATIN (roe SOO va sta tin) is known as a HMG-CoA reductase inhibitor or 'statin'. It lowers cholesterol and triglycerides in the blood. This drug may also reduce the risk of heart attack, stroke, or other health problems in patients with risk factors for heart disease. Diet and lifestyle changes are often used with this drug. This medicine may be used for other purposes; ask your health care provider or pharmacist if you have questions. COMMON BRAND NAME(S): Crestor What should I tell my health care provider before I take this medicine? They need to know if you have any of these conditions:  diabetes  if you often drink alcohol  history of stroke  kidney disease  liver disease  muscle aches or  weakness  thyroid disease  an unusual or allergic reaction to rosuvastatin, other medicines, foods, dyes, or preservatives  pregnant or trying to get pregnant  breast-feeding How should I use this medicine? Take this medicine by mouth with a glass of water. Follow the directions on the prescription label. Do not cut, crush or chew this medicine. You can take this medicine with or without food. Take your doses at regular intervals. Do not take your medicine more often than directed. Talk to your pediatrician regarding the use of this medicine in children. While this drug may be prescribed for children as young as 29 years old for selected conditions, precautions do apply. Overdosage: If you think you have taken too much of this medicine contact a poison control center or emergency room at once. NOTE: This medicine is only for you. Do not share this medicine with others. What if I miss a  dose? If you miss a dose, take it as soon as you can. If your next dose is to be taken in less than 12 hours, then do not take the missed dose. Take the next dose at your regular time. Do not take double or extra doses. What may interact with this medicine? Do not take this medicine with any of the following medications:  herbal medicines like red yeast rice This medicine may also interact with the following medications:  alcohol  antacids containing aluminum hydroxide or magnesium hydroxide  cyclosporine  other medicines for high cholesterol  some medicines for HIV infection  warfarin This list may not describe all possible interactions. Give your health care provider a list of all the medicines, herbs, non-prescription drugs, or dietary supplements you use. Also tell them if you smoke, drink alcohol, or use illegal drugs. Some items may interact with your medicine. What should I watch for while using this medicine? Visit your doctor or health care professional for regular check-ups. You may need  regular tests to make sure your liver is working properly. Your health care professional may tell you to stop taking this medicine if you develop muscle problems. If your muscle problems do not go away after stopping this medicine, contact your health care professional. Do not become pregnant while taking this medicine. Women should inform their health care professional if they wish to become pregnant or think they might be pregnant. There is a potential for serious side effects to an unborn child. Talk to your health care professional or pharmacist for more information. Do not breast-feed an infant while taking this medicine. This medicine may increase blood sugar. Ask your healthcare provider if changes in diet or medicines are needed if you have diabetes. If you are going to need surgery or other procedure, tell your doctor that you are using this medicine. This drug is only part of a total heart-health program. Your doctor or a dietician can suggest a low-cholesterol and low-fat diet to help. Avoid alcohol and smoking, and keep a proper exercise schedule. This medicine may cause a decrease in Co-Enzyme Q-10. You should make sure that you get enough Co-Enzyme Q-10 while you are taking this medicine. Discuss the foods you eat and the vitamins you take with your health care professional. What side effects may I notice from receiving this medicine? Side effects that you should report to your doctor or health care professional as soon as possible:  allergic reactions like skin rash, itching or hives, swelling of the face, lips, or tongue  confusion  joint pain  loss of memory  redness, blistering, peeling or loosening of the skin, including inside the mouth  signs and symptoms of high blood sugar such as being more thirsty or hungry or having to urinate more than normal. You may also feel very tired or have blurry vision.  signs and symptoms of muscle injury like dark urine; trouble passing  urine or change in the amount of urine; unusually weak or tired; muscle pain or side or back pain  yellowing of the eyes or skin Side effects that usually do not require medical attention (report to your doctor or health care professional if they continue or are bothersome):  constipation  diarrhea  dizziness  gas  headache  nausea  stomach pain  trouble sleeping  upset stomach This list may not describe all possible side effects. Call your doctor for medical advice about side effects. You may report side effects to FDA at  1-800-FDA-1088. Where should I keep my medicine? Keep out of the reach of children. Store at room temperature between 20 and 25 degrees C (68 and 77 degrees F). Keep container tightly closed (protect from moisture). Throw away any unused medicine after the expiration date. NOTE: This sheet is a summary. It may not cover all possible information. If you have questions about this medicine, talk to your doctor, pharmacist, or health care provider.  2020 Elsevier/Gold Standard (2018-03-09 08:25:08)

## 2019-12-26 NOTE — Progress Notes (Signed)
Established patient visit   Patient: Amanda Love   DOB: [birth year]   69 y.o. Female  MRN: [id number] Visit Date: 12/26/2019  Today's healthcare provider: Marcille Buffy, FNP   Chief Complaint  Patient presents with  . Hyperlipidemia   Subjective    HPI  Lipid/Cholesterol, Follow-up  Last lipid panel Other pertinent labs  Lab Results  Component Value Date   CHOL 208 (H) 10/26/2018   HDL 63 10/26/2018   LDLCALC 131 (H) 10/26/2018   TRIG 71 10/26/2018   CHOLHDL 3.3 10/26/2018   Lab Results  Component Value Date   ALT 29 10/26/2018   AST 27 10/26/2018   PLT 264 10/26/2018   TSH 2.110 10/26/2018     She was last seen for this 1 years ago.  Management since that visit includes none.With the 5mg  Crestor she feels much betteer than on 10mg . She has no muscle aches like she did on the 10mg  prior.  Saw Chrismon, Vickki Muff, PA last visit changes were made to medication as above.   She reports excellent compliance with treatment. She is not having side effects.  Symptoms: No chest pain No chest pressure/discomfort  No dyspnea No lower extremity edema  No numbness or tingling of extremity No orthopnea  No palpitations No paroxysmal nocturnal dyspnea  No speech difficulty No syncope   Current diet: in general, a "healthy" diet   Current exercise: tredmill   Taking Vitamin D daily. Needs level checked.  Need for e  She also reports having rectal burning only after eating extremely spicy foods that resolves after 2- 3 days. . She has used hydrocortisone cream in past with relief. Denies any irritation at today's visit. Is up to date on colonoscopy and denies any rectal bleeding, pain, or hemorrhoid. Denies any black or tarry stools, or blood in stools.    Patient  denies any fever, body aches,chills, rash, chest pain, shortness of breath, nausea, vomiting, or diarrhea.  Denies dizziness, lightheadedness, pre syncopal or syncopal episodes.    The 10-year ASCVD  risk score Mikey Bussing DC Brooke Bonito., et al., 2013) is: 8.8%  ---------------------------------------------------------------------------------------------------  Patient Active Problem List   Diagnosis Date Noted  . Anal burning 09/08/2016  . Sex counseling 09/08/2016  . HLD (hyperlipidemia) 09/08/2016  . Muscle ache 09/08/2016   History reviewed. No pertinent past medical history. Social History   Tobacco Use  . Smoking status: Never Smoker  . Smokeless tobacco: Never Used  Vaping Use  . Vaping Use: Never used  Substance Use Topics  . Alcohol use: Yes    Comment: occasionally 1 glass of red wine  . Drug use: Not on file   No Known Allergies     Medications: Outpatient Medications Prior to Visit  Medication Sig  . ASCORBIC ACID PO Take 500 mg by mouth 2 (two) times daily.   . ASPIRIN 81 PO Take by mouth daily.   . Calcium Carbonate-Vit D-Min (CALTRATE 600+D PLUS PO) Take by mouth 2 (two) times daily.  Marland Kitchen CALCIUM CARBONATE-VITAMIN D PO Take by mouth.  . cholecalciferol (VITAMIN D) 25 MCG (1000 UT) tablet Take 1,000 Units by mouth daily.  . Coenzyme Q10 10 MG capsule Take 10 mg by mouth daily.  . CYANOCOBALAMIN PO Take by mouth daily.   . Menaquinone-7 (VITAMIN K2) 100 MCG CAPS Take by mouth daily.  . Misc Natural Products (OSTEO BI-FLEX ADV TRIPLE ST PO) Take by mouth daily.  . Multiple Vitamin (MULTIVITAMIN) tablet Take 1 tablet  by mouth daily.  . Probiotic Product ([employer] ADV PROBIOTIC GUMMIES PO) Take by mouth daily.  . promethazine-dextromethorphan (PROMETHAZINE-DM) 6.25-15 MG/5ML syrup Take 5 mLs by mouth 2 (two) times daily as needed for cough.  . SUPER B COMPLEX/C PO Take by mouth daily.  . Turmeric 500 MG TABS Take by mouth daily.  . [DISCONTINUED] rosuvastatin (CRESTOR) 5 MG tablet Take 1 tablet (5 mg total) by mouth daily.   No facility-administered medications prior to visit.    Review of Systems  Last metabolic panel Lab Results  Component Value Date   GLUCOSE 96  10/26/2018   NA 140 10/26/2018   K 4.2 10/26/2018   CL 102 10/26/2018   CO2 26 10/26/2018   BUN 13 10/26/2018   CREATININE 0.57 10/26/2018   GFRNONAA 95 10/26/2018   GFRAA 109 10/26/2018   CALCIUM 9.5 10/26/2018   PROT 7.4 10/26/2018   ALBUMIN 4.8 10/26/2018   LABGLOB 2.6 10/26/2018   AGRATIO 1.8 10/26/2018   BILITOT 0.5 10/26/2018   ALKPHOS 61 10/26/2018   AST 27 10/26/2018   ALT 29 10/26/2018   Last lipids Lab Results  Component Value Date   CHOL 208 (H) 10/26/2018   HDL 63 10/26/2018   LDLCALC 131 (H) 10/26/2018   TRIG 71 10/26/2018   CHOLHDL 3.3 10/26/2018   Last hemoglobin A1c Lab Results  Component Value Date   HGBA1C 5.4 03/29/2017   Last thyroid functions Lab Results  Component Value Date   TSH 2.110 10/26/2018      Objective    BP 124/82   Pulse 71   Temp (!) 96.9 F (36.1 C) (Oral)   Resp 15   Wt 120 lb 9.6 oz (54.7 kg)   SpO2 99%   BMI 21.36 kg/m    Physical Exam Vitals reviewed.  Constitutional:      General: She is not in acute distress.    Appearance: Normal appearance. She is normal weight. She is not ill-appearing, toxic-appearing or diaphoretic.  HENT:     Head: Normocephalic and atraumatic.     Right Ear: External ear normal. There is no impacted cerumen.     Left Ear: External ear normal. There is no impacted cerumen.     Nose: Nose normal.  Cardiovascular:     Rate and Rhythm: Normal rate and regular rhythm.     Pulses: Normal pulses.     Heart sounds: Normal heart sounds. No murmur heard.  No friction rub. No gallop.   Pulmonary:     Effort: Pulmonary effort is normal.     Breath sounds: Normal breath sounds.  Abdominal:     General: There is no distension.     Palpations: Abdomen is soft.     Tenderness: There is no abdominal tenderness.  Genitourinary:    Comments: Declined  Musculoskeletal:        General: No swelling, tenderness, deformity or signs of injury. Normal range of motion.     Cervical back: Normal range  of motion and neck supple.     Right lower leg: No edema.     Left lower leg: No edema.  Skin:    General: Skin is warm and dry.     Capillary Refill: Capillary refill takes less than 2 seconds.  Neurological:     General: No focal deficit present.     Mental Status: She is alert.  Psychiatric:        Mood and Affect: Mood normal.  Behavior: Behavior normal.        Thought Content: Thought content normal.        Judgment: Judgment normal.     No results found for any visits on 12/26/19.  Assessment & Plan    Mixed hyperlipidemia - Plan: Comprehensive metabolic panel, Lipid panel, TSH, CBC with Differential/Platelet, rosuvastatin (CRESTOR) 5 MG tablet  Vitamin D  - Plan: VITAMIN D 25 Hydroxy (Vit-D Deficiency, Fractures)  Anal burning  Continue Crestor 5mg  qd, report any side effects as discussed, myalgias, pain or any extreme fatigue.  Lipid panel, CBC, CMP maintenance labs today.   Meds ordered this encounter  Medications  . rosuvastatin (CRESTOR) 5 MG tablet    Sig: Take 1 tablet (5 mg total) by mouth daily.    Dispense:  90 tablet    Refill:  3  . hydrocortisone (ANUSOL-HC) 2.5 % rectal cream    Sig: Place 1 application rectally 2 (two) times daily.    Dispense:  30 g    Refill:  0   Annual medicare nurse visit after 05/01/2020 and follow up wellness exam with PCP Chrismon, Vickki Muff, PA  An After Visit Summary was printed and given to the patient.   Return in about 6 months (around 06/27/2020), or if symptoms worsen or fail to improve, for at any time for any worsening symptoms, Go to Emergency room/ urgent care if worse.      IWellington Hampshire Khalel Alms, FNP, have reviewed all documentation for this visit. The documentation on 12/26/19 for the exam, diagnosis, procedures, and orders are all accurate and complete.    Marcille Buffy, [place] [phone] (phone) [phone] (fax)  [place]

## 2019-12-27 LAB — COMPREHENSIVE METABOLIC PANEL
ALT: 27 IU/L (ref 0–32)
AST: 28 IU/L (ref 0–40)
Albumin/Globulin Ratio: 1.7 (ref 1.2–2.2)
Albumin: 4.8 g/dL (ref 3.8–4.8)
Alkaline Phosphatase: 75 IU/L (ref 48–121)
BUN/Creatinine Ratio: 22 (ref 12–28)
BUN: 15 mg/dL (ref 8–27)
Bilirubin Total: 0.5 mg/dL (ref 0.0–1.2)
CO2: 25 mmol/L (ref 20–29)
Calcium: 9.4 mg/dL (ref 8.7–10.3)
Chloride: 100 mmol/L (ref 96–106)
Creatinine, Ser: 0.68 mg/dL (ref 0.57–1.00)
GFR calc Af Amer: 102 mL/min/{1.73_m2} (ref >59)
GFR calc non Af Amer: 89 mL/min/{1.73_m2} (ref >59)
Globulin, Total: 2.8 g/dL (ref 1.5–4.5)
Glucose: 88 mg/dL (ref 65–99)
Potassium: 4.2 mmol/L (ref 3.5–5.2)
Sodium: 141 mmol/L (ref 134–144)
Total Protein: 7.6 g/dL (ref 6.0–8.5)

## 2019-12-27 LAB — CBC WITH DIFFERENTIAL/PLATELET
Basophils Absolute: 0 10*3/uL (ref 0.0–0.2)
Basos: 1 %
EOS (ABSOLUTE): 0.1 10*3/uL (ref 0.0–0.4)
Eos: 2 %
Hematocrit: 37.8 % (ref 34.0–46.6)
Hemoglobin: 12.6 g/dL (ref 11.1–15.9)
Immature Grans (Abs): 0 10*3/uL (ref 0.0–0.1)
Immature Granulocytes: 0 %
Lymphocytes Absolute: 1.3 10*3/uL (ref 0.7–3.1)
Lymphs: 47 %
MCH: 31.7 pg (ref 26.6–33.0)
MCHC: 33.3 g/dL (ref 31.5–35.7)
MCV: 95 fL (ref 79–97)
Monocytes Absolute: 0.3 10*3/uL (ref 0.1–0.9)
Monocytes: 12 %
Neutrophils Absolute: 1.1 10*3/uL — ABNORMAL LOW (ref 1.4–7.0)
Neutrophils: 38 %
Platelets: 273 10*3/uL (ref 150–450)
RBC: 3.98 x10E6/uL (ref 3.77–5.28)
RDW: 11.8 % (ref 11.7–15.4)
WBC: 2.7 10*3/uL — ABNORMAL LOW (ref 3.4–10.8)

## 2019-12-27 LAB — LIPID PANEL
Chol/HDL Ratio: 3.6 ratio (ref 0.0–4.4)
Cholesterol, Total: 227 mg/dL — ABNORMAL HIGH (ref 100–199)
HDL: 63 mg/dL (ref >39)
LDL Chol Calc (NIH): 154 mg/dL — ABNORMAL HIGH (ref 0–99)
Triglycerides: 56 mg/dL (ref 0–149)
VLDL Cholesterol Cal: 10 mg/dL (ref 5–40)

## 2019-12-27 LAB — TSH: TSH: 3.23 u[IU]/mL (ref 0.450–4.500)

## 2019-12-27 LAB — VITAMIN D 25 HYDROXY (VIT D DEFICIENCY, FRACTURES): Vit D, 25-Hydroxy: 73.8 ng/mL (ref 30.0–100.0)

## 2019-12-28 NOTE — Progress Notes (Signed)
CMP within normal limits electrolytes,  kidney/ liver function.  CBC WBC is 2.7, lower was 3.0 one year ago, recommend rechecking in 3 months. Please add lab CBC.  TSH for thyroid within normal. Vitamin D within normal.  Total cholesterol and LDL elevated.  Discuss lifestyle modification with patient e.g. increase exercise, fiber, fruits, vegetables, lean meat, and omega 3/fish intake and decrease saturated fat.   She is currently on Crestor 5 mg, continue( could not tolerate 10 mg Crestor)   Looks like she has been on Zetia before - would she be willing to add this back to her regimen?  Zetia 10 mg po QD to help lower cholesterol and also monitor diet/ lifestyle.

## 2019-12-31 ENCOUNTER — Other Ambulatory Visit: Payer: Self-pay

## 2019-12-31 DIAGNOSIS — M5412 Radiculopathy, cervical region: Secondary | ICD-10-CM | POA: Diagnosis not present

## 2019-12-31 DIAGNOSIS — M62830 Muscle spasm of back: Secondary | ICD-10-CM | POA: Diagnosis not present

## 2019-12-31 DIAGNOSIS — M9902 Segmental and somatic dysfunction of thoracic region: Secondary | ICD-10-CM | POA: Diagnosis not present

## 2019-12-31 DIAGNOSIS — M9901 Segmental and somatic dysfunction of cervical region: Secondary | ICD-10-CM | POA: Diagnosis not present

## 2019-12-31 DIAGNOSIS — E782 Mixed hyperlipidemia: Secondary | ICD-10-CM

## 2019-12-31 NOTE — Telephone Encounter (Signed)
-----   Message from Doreen Beam, [place] sent at 12/28/2019 10:47 AM EDT ----- CMP within normal limits electrolytes,  kidney/ liver function.  CBC WBC is 2.7, lower was 3.0 one year ago, recommend rechecking in 3 months. Please add lab CBC.  TSH for thyroid within normal. Vitamin D within normal.  Total cholesterol and LDL elevated.  Discuss lifestyle modification with patient e.g. increase exercise, fiber, fruits, vegetables, lean meat, and omega 3/fish intake and decrease saturated fat.   She is currently on Crestor 5 mg, continue( could not tolerate 10 mg Crestor)   Looks like she has been on Zetia before - would she be willing to add this back to her regimen?  Zetia 10 mg po QD to help lower cholesterol and also monitor diet/ lifestyle.

## 2019-12-31 NOTE — Telephone Encounter (Signed)
Pt advised.  She would like to try Zetia 10mg .  Please send a 30 day supply to [facility].

## 2020-01-01 MED ORDER — EZETIMIBE 10 MG PO TABS: 10.0000 mg | ORAL_TABLET | Freq: Every day | ORAL | 1 refills | Status: DC

## 2020-01-21 ENCOUNTER — Ambulatory Visit: Payer: Medicare Other | Admitting: Dermatology

## 2020-01-24 ENCOUNTER — Other Ambulatory Visit: Payer: Self-pay | Admitting: Adult Health

## 2020-01-24 ENCOUNTER — Ambulatory Visit: Payer: Medicare Other | Admitting: Dermatology

## 2020-01-24 DIAGNOSIS — E782 Mixed hyperlipidemia: Secondary | ICD-10-CM

## 2020-01-28 DIAGNOSIS — M62830 Muscle spasm of back: Secondary | ICD-10-CM | POA: Diagnosis not present

## 2020-01-28 DIAGNOSIS — M9902 Segmental and somatic dysfunction of thoracic region: Secondary | ICD-10-CM | POA: Diagnosis not present

## 2020-01-28 DIAGNOSIS — M5412 Radiculopathy, cervical region: Secondary | ICD-10-CM | POA: Diagnosis not present

## 2020-01-28 DIAGNOSIS — M9901 Segmental and somatic dysfunction of cervical region: Secondary | ICD-10-CM | POA: Diagnosis not present

## 2020-02-25 DIAGNOSIS — M9901 Segmental and somatic dysfunction of cervical region: Secondary | ICD-10-CM | POA: Diagnosis not present

## 2020-02-25 DIAGNOSIS — M5412 Radiculopathy, cervical region: Secondary | ICD-10-CM | POA: Diagnosis not present

## 2020-02-25 DIAGNOSIS — M62830 Muscle spasm of back: Secondary | ICD-10-CM | POA: Diagnosis not present

## 2020-02-25 DIAGNOSIS — M9902 Segmental and somatic dysfunction of thoracic region: Secondary | ICD-10-CM | POA: Diagnosis not present

## 2020-02-28 DIAGNOSIS — Z23 Encounter for immunization: Secondary | ICD-10-CM | POA: Diagnosis not present

## 2020-03-18 ENCOUNTER — Other Ambulatory Visit: Payer: Self-pay | Admitting: Family Medicine

## 2020-03-24 DIAGNOSIS — M62830 Muscle spasm of back: Secondary | ICD-10-CM | POA: Diagnosis not present

## 2020-03-24 DIAGNOSIS — M5412 Radiculopathy, cervical region: Secondary | ICD-10-CM | POA: Diagnosis not present

## 2020-03-24 DIAGNOSIS — M9902 Segmental and somatic dysfunction of thoracic region: Secondary | ICD-10-CM | POA: Diagnosis not present

## 2020-03-24 DIAGNOSIS — M9901 Segmental and somatic dysfunction of cervical region: Secondary | ICD-10-CM | POA: Diagnosis not present

## 2020-03-28 ENCOUNTER — Encounter: Payer: Self-pay | Admitting: Family Medicine

## 2020-03-28 ENCOUNTER — Other Ambulatory Visit: Payer: Self-pay

## 2020-03-28 ENCOUNTER — Ambulatory Visit (INDEPENDENT_AMBULATORY_CARE_PROVIDER_SITE_OTHER): Payer: Medicare Other | Admitting: Family Medicine

## 2020-03-28 VITALS — Temp 98.4°F | Wt 114.0 lb

## 2020-03-28 DIAGNOSIS — J029 Acute pharyngitis, unspecified: Secondary | ICD-10-CM | POA: Diagnosis not present

## 2020-03-28 DIAGNOSIS — J069 Acute upper respiratory infection, unspecified: Secondary | ICD-10-CM

## 2020-03-28 DIAGNOSIS — R059 Cough, unspecified: Secondary | ICD-10-CM | POA: Diagnosis not present

## 2020-03-28 MED ORDER — PROMETHAZINE-DM 6.25-15 MG/5ML PO SYRP: 5.0000 mL | ORAL_SOLUTION | Freq: Two times a day (BID) | ORAL | 0 refills | Status: DC | PRN

## 2020-03-28 NOTE — Progress Notes (Signed)
Virtual telephone visit    Virtual Visit via Telephone Note   This visit type was conducted due to national recommendations for restrictions regarding the COVID-19 Pandemic (e.g. social distancing) in an effort to limit this patient's exposure and mitigate transmission in our community. Due to her co-morbid illnesses, this patient is at least at moderate risk for complications without adequate follow up. This format is felt to be most appropriate for this patient at this time. The patient did not have access to video technology or had technical difficulties with video requiring transitioning to audio format only (telephone). Physical exam was limited to content and character of the telephone converstion.    Patient location: Car in parking lot Provider location: Office  I discussed the limitations of evaluation and management by telemedicine and the availability of in person appointments. The patient expressed understanding and agreed to proceed.   Visit Date: 03/28/2020  Today's healthcare provider: Vernie Murders, PA   No chief complaint on file.  Subjective    HPI  Patient initially came to the office for follow up of lipids.  She had been seen 3 months ago and had Zetia added to her medications.  While rooming the patient and doing the work up for her cholesterol she then revealed that she has started having cough, congestion and sore throat.   After having to have a lengthy explaination with the patient on  the procedure for seeing sick patients she consented to doing a phone visit from her car.      No past medical history on file. No past surgical history on file. Social History   Tobacco Use  . Smoking status: Never Smoker  . Smokeless tobacco: Never Used  Vaping Use  . Vaping Use: Never used  Substance Use Topics  . Alcohol use: Yes    Comment: occasionally 1 glass of red wine  . Drug use: Not on file   Family Status  Relation Name Status  . Mother  Deceased   . Father  Deceased  . Sister  Alive  . Brother  Alive  . Brother  Alive  . Brother  Alive  . Neg Hx  (Not Specified)   No Known Allergies   Medications: Outpatient Medications Prior to Visit  Medication Sig  . ASCORBIC ACID PO Take 500 mg by mouth 2 (two) times daily.   . ASPIRIN 81 PO Take by mouth daily.   . Calcium Carbonate-Vit D-Min (CALTRATE 600+D PLUS PO) Take by mouth 2 (two) times daily.  Marland Kitchen CALCIUM CARBONATE-VITAMIN D PO Take by mouth.  . cholecalciferol (VITAMIN D) 25 MCG (1000 UT) tablet Take 1,000 Units by mouth daily.  . Coenzyme Q10 10 MG capsule Take 10 mg by mouth daily.  . CYANOCOBALAMIN PO Take by mouth daily.   Marland Kitchen ezetimibe (ZETIA) 10 MG tablet Take 1 tablet (10 mg total) by mouth daily.  . hydrocortisone (ANUSOL-HC) 2.5 % rectal cream Place 1 application rectally 2 (two) times daily.  . Menaquinone-7 (VITAMIN K2) 100 MCG CAPS Take by mouth daily.  . Misc Natural Products (OSTEO BI-FLEX ADV TRIPLE ST PO) Take by mouth daily.  . Multiple Vitamin (MULTIVITAMIN) tablet Take 1 tablet by mouth daily.  . Probiotic Product ([employer] ADV PROBIOTIC GUMMIES PO) Take by mouth daily.  . promethazine-dextromethorphan (PROMETHAZINE-DM) 6.25-15 MG/5ML syrup Take 5 mLs by mouth 2 (two) times daily as needed for cough.  . rosuvastatin (CRESTOR) 5 MG tablet Take 1 tablet (5 mg total) by mouth daily.  Marland Kitchen  SUPER B COMPLEX/C PO Take by mouth daily.  . Turmeric 500 MG TABS Take by mouth daily.   No facility-administered medications prior to visit.    Review of Systems  Constitutional: Negative for fever.  HENT: Positive for congestion and sore throat. Negative for sinus pain.   Respiratory: Positive for cough. Negative for shortness of breath.   Gastrointestinal: Negative for abdominal pain and diarrhea.  Neurological: Negative for headaches.      Objective    There were no vitals taken for this visit.     Assessment & Plan     1. Cough Onset of cough over the past 2 days.  No fever but having sore throat. Denies loss of taste or smell and no significant fatigue or body aches. Had her COVID booster shot 3 weeks ago. Concern is for husband that has had a stroke. Delsym only slight help with cough control. Will refill Promethazine-DM and get COVID test. - Novel Coronavirus, NAA ([facility]) - promethazine-dextromethorphan (PROMETHAZINE-DM) 6.25-15 MG/5ML syrup; Take 5 mLs by mouth 2 (two) times daily as needed for cough.  Dispense: 118 mL; Refill: 0  2. Sore throat Scratchy throat with cough the past 2 days. Check for COVID infection and counseled regarding home quarantine. Recheck as needed. - Novel Coronavirus, NAA ([facility])    No follow-ups on file.    I discussed the assessment and treatment plan with the patient. The patient was provided an opportunity to ask questions and all were answered. The patient agreed with the plan and demonstrated an understanding of the instructions.   The patient was advised to call back or seek an in-person evaluation if the symptoms worsen or if the condition fails to improve as anticipated.  I provided 20 minutes of non-face-to-face time during this encounter.  Andres Shad, PA, have reviewed all documentation for this visit. The documentation on 03/28/20 for the exam, diagnosis, procedures, and orders are all accurate and complete.   Vernie Murders, [place] [phone] (phone) [phone] (fax)  [place]

## 2020-03-29 LAB — NOVEL CORONAVIRUS, NAA: SARS-CoV-2, NAA: NOT DETECTED

## 2020-03-29 LAB — SARS-COV-2, NAA 2 DAY TAT

## 2020-03-30 ENCOUNTER — Other Ambulatory Visit: Payer: Self-pay | Admitting: Family Medicine

## 2020-03-30 DIAGNOSIS — E782 Mixed hyperlipidemia: Secondary | ICD-10-CM

## 2020-03-30 DIAGNOSIS — D709 Neutropenia, unspecified: Secondary | ICD-10-CM

## 2020-03-31 ENCOUNTER — Other Ambulatory Visit: Payer: Self-pay

## 2020-03-31 DIAGNOSIS — E782 Mixed hyperlipidemia: Secondary | ICD-10-CM

## 2020-03-31 MED ORDER — EZETIMIBE 10 MG PO TABS: 10.0000 mg | ORAL_TABLET | Freq: Every day | ORAL | 0 refills | Status: DC

## 2020-03-31 NOTE — Telephone Encounter (Signed)
-----   Message from Margo Common, [place] sent at 03/30/2020  9:42 PM EDT ----- Kermit Balo news. No evidence of COViD infection. Will place order to come by the lab to get recheck of chemistry and lipid panel with blood cell count follow up. Order to be released when she comes in for blood draw.

## 2020-03-31 NOTE — Telephone Encounter (Signed)
Patient notified for test result but she wanted Rx for Zetia for whole year supply advised her that sometime refill needs appointment for follow up and lab work etc she also need mammogram order for this year --does she needs to make an appointment her next due is 05/2020 as per previous notes.

## 2020-04-17 NOTE — Progress Notes (Signed)
I,April Miller,acting as a Education administrator for [employer], [employer].,have documented all relevant documentation on the behalf of [employer], [employer],as directed by  [employer] while in the presence of [employer], [place].  Established patient visit   Patient: Amanda Love   DOB: 12/28/1950   69 y.o. Female  MRN: [id number] Visit Date: 04/18/2020  Today's healthcare provider: Vernie Murders, PA   Chief Complaint  Patient presents with  . Follow-up  . Hyperlipidemia   Subjective    HPI  Lipid/Cholesterol, Follow-up  Last lipid panel Other pertinent labs  Lab Results  Component Value Date   CHOL 227 (H) 12/26/2019   HDL 63 12/26/2019   LDLCALC 154 (H) 12/26/2019   TRIG 56 12/26/2019   CHOLHDL 3.6 12/26/2019   Lab Results  Component Value Date   ALT 27 12/26/2019   AST 28 12/26/2019   PLT 273 12/26/2019   TSH 3.230 12/26/2019     She was last seen for this 4 months ago.  Management since that visit includes no changes.  She reports good compliance with treatment. She is not having side effects. none Current diet: in general, a "healthy" diet   Current exercise: walking  The 10-year ASCVD risk score Mikey Bussing DC Jr., et al., 2013) is: 10.1%   History reviewed. No pertinent past medical history. History reviewed. No pertinent surgical history. Social History   Tobacco Use  . Smoking status: Never Smoker  . Smokeless tobacco: Never Used  Vaping Use  . Vaping Use: Never used  Substance Use Topics  . Alcohol use: Yes    Comment: occasionally 1 glass of red wine  . Drug use: Not on file   Family History  Problem Relation Age of Onset  . Breast cancer Neg Hx    No Known Allergies     Medications: Outpatient Medications Prior to Visit  Medication Sig  . ASCORBIC ACID PO Take 500 mg by mouth 2 (two) times daily.   . ASPIRIN 81 PO Take by mouth daily.   . Calcium Carbonate-Vit D-Min (CALTRATE 600+D PLUS PO) Take by mouth 2 (two) times daily.  Marland Kitchen CALCIUM  CARBONATE-VITAMIN D PO Take by mouth.  . cholecalciferol (VITAMIN D) 25 MCG (1000 UT) tablet Take 1,000 Units by mouth daily.  . Coenzyme Q10 10 MG capsule Take 10 mg by mouth daily.  . CYANOCOBALAMIN PO Take by mouth daily.   Marland Kitchen ezetimibe (ZETIA) 10 MG tablet Take 1 tablet (10 mg total) by mouth daily. Remind patient to get labs done.  . hydrocortisone (ANUSOL-HC) 2.5 % rectal cream Place 1 application rectally 2 (two) times daily.  . Menaquinone-7 (VITAMIN K2) 100 MCG CAPS Take by mouth daily.  . Misc Natural Products (OSTEO BI-FLEX ADV TRIPLE ST PO) Take by mouth daily.  . Multiple Vitamin (MULTIVITAMIN) tablet Take 1 tablet by mouth daily.  . Probiotic Product (CVS ADV PROBIOTIC GUMMIES PO) Take by mouth daily.  . promethazine-dextromethorphan (PROMETHAZINE-DM) 6.25-15 MG/5ML syrup Take 5 mLs by mouth 2 (two) times daily as needed for cough.  . rosuvastatin (CRESTOR) 5 MG tablet Take 1 tablet (5 mg total) by mouth daily.  . SUPER B COMPLEX/C PO Take by mouth daily.  . Turmeric 500 MG TABS Take by mouth daily.   No facility-administered medications prior to visit.    Review of Systems  Constitutional: Negative.   Respiratory: Negative.   Cardiovascular: Negative.   Musculoskeletal: Negative.   Neurological: Negative.       Objective  There were no vitals taken for this visit.   Physical Exam Constitutional:      General: She is not in acute distress.    Appearance: She is well-developed.  HENT:     Head: Normocephalic and atraumatic.     Right Ear: Hearing normal.     Left Ear: Hearing normal.     Nose: Nose normal.  Eyes:     General: Lids are normal. No scleral icterus.       Right eye: No discharge.        Left eye: No discharge.     Conjunctiva/sclera: Conjunctivae normal.  Cardiovascular:     Rate and Rhythm: Normal rate and regular rhythm.     Heart sounds: Normal heart sounds.  Pulmonary:     Effort: Pulmonary effort is normal. No respiratory distress.      Breath sounds: Normal breath sounds.  Abdominal:     General: Bowel sounds are normal.     Palpations: Abdomen is soft.  Musculoskeletal:        General: Normal range of motion.     Cervical back: Neck supple.  Skin:    Findings: No lesion or rash.  Neurological:     Mental Status: She is alert and oriented to person, place, and time.  Psychiatric:        Speech: Speech normal.        Behavior: Behavior normal.        Thought Content: Thought content normal.     No results found for any visits on 04/18/20.  Assessment & Plan     1. Mixed hyperlipidemia Has been on Crestor 5 mg qd with Zetia 10 mg qd. Having some headache and muscle aches. Excited to see Lipid Panel and CMP. Would like to be able to decrease treatment to only the Zetia with Co-Q 10 100 mg qd and Fish Oil.  - Comprehensive metabolic panel - Lipid panel  2. Need for influenza vaccination - Flu Vaccine QUAD High Dose(Fluad)  3. Breast cancer screening by mammogram Asymptomatic. - MM Digital Screening  No follow-ups on file.      Andres Shad, PA, have reviewed all documentation for this visit. The documentation on 04/18/20 for the exam, diagnosis, procedures, and orders are all accurate and complete.    Vernie Murders, [place] [phone] (phone) [phone] (fax)  [place]

## 2020-04-18 ENCOUNTER — Other Ambulatory Visit: Payer: Self-pay

## 2020-04-18 ENCOUNTER — Other Ambulatory Visit: Payer: Self-pay | Admitting: Family Medicine

## 2020-04-18 ENCOUNTER — Encounter: Payer: Self-pay | Admitting: Family Medicine

## 2020-04-18 ENCOUNTER — Ambulatory Visit (INDEPENDENT_AMBULATORY_CARE_PROVIDER_SITE_OTHER): Payer: Medicare Other | Admitting: Family Medicine

## 2020-04-18 DIAGNOSIS — Z23 Encounter for immunization: Secondary | ICD-10-CM

## 2020-04-18 DIAGNOSIS — E782 Mixed hyperlipidemia: Secondary | ICD-10-CM | POA: Diagnosis not present

## 2020-04-18 DIAGNOSIS — Z1231 Encounter for screening mammogram for malignant neoplasm of breast: Secondary | ICD-10-CM

## 2020-04-19 LAB — COMPREHENSIVE METABOLIC PANEL
ALT: 41 IU/L — ABNORMAL HIGH (ref 0–32)
AST: 35 IU/L (ref 0–40)
Albumin/Globulin Ratio: 1.5 (ref 1.2–2.2)
Albumin: 4.5 g/dL (ref 3.7–4.7)
Alkaline Phosphatase: 77 IU/L (ref 44–121)
BUN/Creatinine Ratio: 23 (ref 12–28)
BUN: 14 mg/dL (ref 8–27)
Bilirubin Total: 0.4 mg/dL (ref 0.0–1.2)
CO2: 22 mmol/L (ref 20–29)
Calcium: 9.4 mg/dL (ref 8.7–10.3)
Chloride: 105 mmol/L (ref 96–106)
Creatinine, Ser: 0.6 mg/dL (ref 0.57–1.00)
GFR calc Af Amer: 106 mL/min/{1.73_m2} (ref >59)
GFR calc non Af Amer: 92 mL/min/{1.73_m2} (ref >59)
Globulin, Total: 3.1 g/dL (ref 1.5–4.5)
Glucose: 98 mg/dL (ref 65–99)
Potassium: 4.3 mmol/L (ref 3.5–5.2)
Sodium: 141 mmol/L (ref 134–144)
Total Protein: 7.6 g/dL (ref 6.0–8.5)

## 2020-04-19 LAB — LIPID PANEL
Chol/HDL Ratio: 3 ratio (ref 0.0–4.4)
Cholesterol, Total: 185 mg/dL (ref 100–199)
HDL: 61 mg/dL (ref >39)
LDL Chol Calc (NIH): 113 mg/dL — ABNORMAL HIGH (ref 0–99)
Triglycerides: 57 mg/dL (ref 0–149)
VLDL Cholesterol Cal: 11 mg/dL (ref 5–40)

## 2020-04-22 ENCOUNTER — Other Ambulatory Visit: Payer: Self-pay

## 2020-04-22 ENCOUNTER — Ambulatory Visit
Admission: RE | Admit: 2020-04-22 | Discharge: 2020-04-22 | Disposition: A | Discharge status: Home / Self Care | Payer: Medicare Other | Source: Ambulatory Visit | Attending: Family Medicine | Admitting: Family Medicine

## 2020-04-22 DIAGNOSIS — Z1231 Encounter for screening mammogram for malignant neoplasm of breast: Secondary | ICD-10-CM | POA: Diagnosis not present

## 2020-04-26 ENCOUNTER — Encounter: Payer: Self-pay | Admitting: Family Medicine

## 2020-04-28 ENCOUNTER — Telehealth: Payer: Self-pay

## 2020-04-28 NOTE — Telephone Encounter (Signed)
She has messaged through patient messages, phone and results.  I don't think she will accept information from anyone but you.  Sorry

## 2020-04-28 NOTE — Telephone Encounter (Signed)
Copied from [place] [id number]. Topic: General - Other >> Apr 28, 2020 10:43 AM Leward Quan A wrote: Reason for CRM: Patient called in to speak to [facility] about her labs that was done recently. Please call Ph# [phone]

## 2020-04-28 NOTE — Telephone Encounter (Signed)
Reviewed recent Lipid Panel and CMP with patient. Agreed to continue Zetia 10 mg qd and decrease Crestor 5 mg to QOD. Recheck levels in 6 months.

## 2020-05-05 NOTE — Progress Notes (Addendum)
Subjective:   Amanda Love is a 69 y.o. female who presents for Medicare Annual (Subsequent) preventive examination.  I connected with Danice Goltz. today by telephone and verified that I am speaking with the correct person using two identifiers. Location patient: home Location provider: work Persons participating in the virtual visit: patient, provider.   I discussed the limitations, risks, security and privacy concerns of performing an evaluation and management service by telephone and the availability of in person appointments. I also discussed with the patient that there may be a patient responsible charge related to this service. The patient expressed understanding and verbally consented to this telephonic visit.    Interactive audio and video telecommunications were attempted between this provider and patient, however failed, due to patient having technical difficulties OR patient did not have access to video capability.  We continued and completed visit with audio only.   Review of Systems    N/A  Cardiac Risk Factors include: advanced age (>31men, >16 women);dyslipidemia     Objective:    There were no vitals filed for this visit. There is no height or weight on file to calculate BMI.  Advanced Directives 05/06/2020 05/02/2019  Does Patient Have a Medical Advance Directive? No No  Would patient like information on creating a medical advance directive? No - Patient declined No - Patient declined    Current Medications (verified) Outpatient Encounter Medications as of 05/06/2020  Medication Sig   ASCORBIC ACID PO Take 500 mg by mouth 2 (two) times daily.    aspirin 325 MG tablet Take 325 mg by mouth daily.   Calcium Carbonate-Vit D-Min (CALTRATE 600+D PLUS PO) Take by mouth daily.    Cholecalciferol (VITAMIN D3 MAXIMUM STRENGTH) 125 MCG (5000 UT) capsule Take 5,000 Units by mouth daily.   Coenzyme Q10 10 MG capsule Take 300 mg by mouth daily.    ezetimibe (ZETIA) 10 MG tablet  Take 1 tablet (10 mg total) by mouth daily. Remind patient to get labs done.   hydrocortisone (ANUSOL-HC) 2.5 % rectal cream Place 1 application rectally 2 (two) times daily.   Menaquinone-7 (VITAMIN K2) 100 MCG CAPS Take by mouth daily.   Misc Natural Products (OSTEO BI-FLEX ADV TRIPLE ST PO) Take by mouth 2 (two) times daily.    Multiple Vitamin (MULTIVITAMIN) tablet Take 1 tablet by mouth daily.   rosuvastatin (CRESTOR) 5 MG tablet Take 1 tablet (5 mg total) by mouth daily.   SUPER B COMPLEX/C PO Take by mouth daily.   Turmeric 500 MG TABS Take by mouth daily.   zinc gluconate 50 MG tablet Take 50 mg by mouth daily.   ASPIRIN 81 PO Take by mouth daily.  (Patient not taking: Reported on 05/06/2020)   CALCIUM CARBONATE-VITAMIN D PO Take by mouth. (Patient not taking: Reported on 05/06/2020)   cholecalciferol (VITAMIN D) 25 MCG (1000 UT) tablet Take 1,000 Units by mouth daily. (Patient not taking: Reported on 05/06/2020)   CYANOCOBALAMIN PO Take by mouth daily.  (Patient not taking: Reported on 05/06/2020)   Probiotic Product (CVS ADV PROBIOTIC GUMMIES PO) Take by mouth daily. (Patient not taking: Reported on 05/06/2020)   promethazine-dextromethorphan (PROMETHAZINE-DM) 6.25-15 MG/5ML syrup Take 5 mLs by mouth 2 (two) times daily as needed for cough. (Patient not taking: Reported on 05/06/2020)   No facility-administered encounter medications on file as of 05/06/2020.    Allergies (verified) Patient has no known allergies.   History: Past Medical History:  Diagnosis Date   Hyperlipidemia  History reviewed. No pertinent surgical history. Family History  Problem Relation Age of Onset   Breast cancer Neg Hx    Social History   Socioeconomic History   Marital status: Married    Spouse name: Not on file   Number of children: 1   Years of education: Not on file   Highest education level: Some college, no degree  Occupational History   Not on file  Tobacco Use   Smoking status: Never  Smoker   Smokeless tobacco: Never Used  Vaping Use   Vaping Use: Never used  Substance and Sexual Activity   Alcohol use: Not Currently   Drug use: Not on file   Sexual activity: Not on file  Other Topics Concern   Not on file  Social History Narrative   Not on file   Social Determinants of Health   Financial Resource Strain: Low Risk    Difficulty of Paying Living Expenses: Not hard at all  Food Insecurity: No Food Insecurity   Worried About Charity fundraiser in the Last Year: Never true   [place] in the Last Year: Never true  Transportation Needs: No Transportation Needs   Lack of Transportation (Medical): No   Lack of Transportation (Non-Medical): No  Physical Activity: Insufficiently Active   Days of Exercise per Week: 3 days   Minutes of Exercise per Session: 40 min  Stress: No Stress Concern Present   Feeling of Stress : Not at all  Social Connections: Socially Integrated   Frequency of Communication with Friends and Family: More than three times a week   Frequency of Social Gatherings with Friends and Family: More than three times a week   Attends Religious Services: More than 4 times per year   Active Member of [employer] or Organizations: Yes   Attends Music therapist: More than 4 times per year   Marital Status: Married    Tobacco Counseling Counseling given: Not Answered   Clinical Intake:  Pre-visit preparation completed: Yes  Pain : No/denies pain     Nutritional Risks: None Diabetes: No  How often do you need to have someone help you when you read instructions, pamphlets, or other written materials from your doctor or pharmacy?: 1 - Never  Diabetic? No  Interpreter Needed?: No  Information entered by :: [facility], LPN   Activities of Daily Living In your present state of health, do you have any difficulty performing the following activities: 05/06/2020 04/18/2020  Hearing? N N  Vision? N N  Difficulty concentrating or  making decisions? N N  Walking or climbing stairs? N N  Dressing or bathing? N N  Doing errands, shopping? N N  Preparing Food and eating ? N -  Using the Toilet? N -  In the past six months, have you accidently leaked urine? N -  Do you have problems with loss of bowel control? N -  Managing your Medications? N -  Managing your Finances? N -  Housekeeping or managing your Housekeeping? N -  Some recent data might be hidden    Patient Care Team: Chrismon, Vickki Muff, PA as PCP - General (Family Medicine) Lorelee Cover., MD (Ophthalmology)  Indicate any recent Medical Services you may have received from other than [facility] providers in the past year (date may be approximate).     Assessment:   This is a routine wellness examination for Amanda Love.  Hearing/Vision screen No exam data present  Dietary issues and exercise  activities discussed: Current Exercise Habits: Home exercise routine, Type of exercise: treadmill;walking, Time (Minutes): 40, Frequency (Times/Week): 3, Weekly Exercise (Minutes/Week): 120, Intensity: Mild, Exercise limited by: None identified  Goals      DIET - INCREASE WATER INTAKE     Recommend to drink at least 6-8 8oz glasses of water per day.       Depression Screen PHQ 2/9 Scores 05/06/2020 04/18/2020 03/28/2020 05/02/2019 10/26/2018 04/12/2017  PHQ - 2 Score 0 0 0 0 0 0  PHQ- 9 Score - 0 - - - -    Fall Risk Fall Risk  05/06/2020 04/18/2020 03/28/2020 05/02/2019 04/12/2017  Falls in the past year? 0 0 1 0 No  Number falls in past yr: 0 0 0 0 -  Injury with Fall? 0 0 0 0 -  Follow up - Falls evaluation completed - - -    FALL RISK PREVENTION PERTAINING TO THE HOME:  Any stairs in or around the home? Yes  If so, are there any without handrails? No  Home free of loose throw rugs in walkways, pet beds, electrical cords, etc? Yes  Adequate lighting in your home to reduce risk of falls? Yes   ASSISTIVE DEVICES UTILIZED TO PREVENT FALLS:  Life alert? No   Use of a cane, walker or w/c? No  Grab bars in the bathroom? Yes  Shower chair or bench in shower? Yes  Elevated toilet seat or a handicapped toilet? No    Cognitive Function: Declined today.          Immunizations Immunization History  Administered Date(s) Administered   Fluad Quad(high Dose 65+) 04/18/2020   Influenza, High Dose Seasonal PF 03/28/2018, 02/16/2019   Influenza-Unspecified 04/06/2017   PFIZER SARS-COV-2 Vaccination 06/21/2019, 07/12/2019   Pneumococcal Conjugate-13 03/28/2018    TDAP status: Due, Education has been provided regarding the importance of this vaccine. Advised may receive this vaccine at local pharmacy or Health Dept. Aware to provide a copy of the vaccination record if obtained from local pharmacy or Health Dept. Verbalized acceptance and understanding.  Flu Vaccine status: Up to date  Pneumococcal vaccine status: Due, Education has been provided regarding the importance of this vaccine. Advised may receive this vaccine at local pharmacy or Health Dept. Aware to provide a copy of the vaccination record if obtained from local pharmacy or Health Dept. Verbalized acceptance and understanding.  Covid-19 vaccine status: Completed vaccines  Qualifies for Shingles Vaccine? Yes   Zostavax completed No   Shingrix Completed?: No.    Education has been provided regarding the importance of this vaccine. Patient has been advised to call insurance company to determine out of pocket expense if they have not yet received this vaccine. Advised may also receive vaccine at local pharmacy or Health Dept. Verbalized acceptance and understanding.  Screening Tests Health Maintenance  Topic Date Due   DEXA SCAN  Never done   PNA vac Low Risk Adult (2 of 2 - PPSV23) 03/29/2019   TETANUS/TDAP  05/06/2021 (Originally 02/13/1970)   COLONOSCOPY  08/09/2021   MAMMOGRAM  04/22/2022   INFLUENZA VACCINE  Completed   COVID-19 Vaccine  Completed   Hepatitis C Screening   Completed    Health Maintenance  Health Maintenance Due  Topic Date Due   DEXA SCAN  Never done   PNA vac Low Risk Adult (2 of 2 - PPSV23) 03/29/2019    Colorectal cancer screening: Type of screening: Colonoscopy. Completed 08/10/11. Repeat every 10 years  Mammogram status: Completed 04/22/20. Repeat  every year  Bone Density status: Currently due. Declined order at this time.  Lung Cancer Screening: (Low Dose CT Chest recommended if Age 45-80 years, 30 pack-year currently smoking OR have quit w/in 15years.) does not qualify.   Additional Screening:  Hepatitis C Screening: Up to date  Vision Screening: Recommended annual ophthalmology exams for early detection of glaucoma and other disorders of the eye. Is the patient up to date with their annual eye exam?  Yes  Who is the provider or what is the name of the office in which the patient attends annual eye exams? Dr Gloriann Loan If pt is not established with a provider, would they like to be referred to a provider to establish care? No .   Dental Screening: Recommended annual dental exams for proper oral hygiene  Community Resource Referral / Chronic Care Management: CRR required this visit?  No   CCM required this visit?  No      Plan:     I have personally reviewed and noted the following in the patient's chart:   Medical and social history Use of alcohol, tobacco or illicit drugs  Current medications and supplements Functional ability and status Nutritional status Physical activity Advanced directives List of other physicians Hospitalizations, surgeries, and ER visits in previous 12 months Vitals Screenings to include cognitive, depression, and falls Referrals and appointments  In addition, I have reviewed and discussed with patient certain preventive protocols, quality metrics, and best practice recommendations. A written personalized care plan for preventive services as well as general preventive health recommendations  were provided to patient.     Chiana Wamser [place], [place]   [id number]   Nurse Notes: Pt declined a DEXA order at this time. Pt to check with pharmacy about receiving a Pneumovax 23 vaccine.   Reviewed note and plan of Nurse Health Advisor's screening. Agree with documentation and recommendations.

## 2020-05-06 ENCOUNTER — Encounter: Payer: Self-pay | Admitting: Family Medicine

## 2020-05-06 ENCOUNTER — Ambulatory Visit (INDEPENDENT_AMBULATORY_CARE_PROVIDER_SITE_OTHER): Payer: Medicare Other

## 2020-05-06 ENCOUNTER — Ambulatory Visit: Payer: Self-pay

## 2020-05-06 ENCOUNTER — Other Ambulatory Visit: Payer: Self-pay

## 2020-05-06 DIAGNOSIS — Z0000 Encounter for general adult medical examination without abnormal findings: Secondary | ICD-10-CM

## 2020-05-06 NOTE — Patient Instructions (Signed)
[name] , Thank you for taking time to come for your Medicare Wellness Visit. I appreciate your ongoing commitment to your health goals. Please review the following plan we discussed and let me know if I can assist you in the future.   Screening recommendations/referrals: Colonoscopy: Up to date, due 07/2021 Mammogram: Up to date, due 03/2021 Bone Density: Currently due, declined order at this time. Recommended yearly ophthalmology/optometry visit for glaucoma screening and checkup Recommended yearly dental visit for hygiene and checkup  Vaccinations: Influenza vaccine: Done 04/18/20 Pneumococcal vaccine: Pneumovax 23 due. Pt to check with pharmacy about receiving.  Tdap vaccine: Currently due, declined at this time. Shingles vaccine: Shingrix discussed. Please contact your pharmacy for coverage information.     Advanced directives: Advance directive discussed with you today. Even though you declined this today please call our office should you change your mind and we can give you the proper paperwork for you to fill out.  Conditions/risks identified: Continue to increase water intake to 6-8 8oz glasses a day.   Next appointment: 10/16/20 @ 9:00 AM with [place] 65 Years and Older, Female Preventive care refers to lifestyle choices and visits with your health care provider that can promote health and wellness. What does preventive care include?  A yearly physical exam. This is also called an annual well check.  Dental exams once or twice a year.  Routine eye exams. Ask your health care provider how often you should have your eyes checked.  Personal lifestyle choices, including:  Daily care of your teeth and gums.  Regular physical activity.  Eating a healthy diet.  Avoiding tobacco and drug use.  Limiting alcohol use.  Practicing safe sex.  Taking low-dose aspirin every day.  Taking vitamin and mineral supplements as recommended by your  health care provider. What happens during an annual well check? The services and screenings done by your health care provider during your annual well check will depend on your age, overall health, lifestyle risk factors, and family history of disease. Counseling  Your health care provider may ask you questions about your:  Alcohol use.  Tobacco use.  Drug use.  Emotional well-being.  Home and relationship well-being.  Sexual activity.  Eating habits.  History of falls.  Memory and ability to understand (cognition).  Work and work Statistician.  Reproductive health. Screening  You may have the following tests or measurements:  Height, weight, and BMI.  Blood pressure.  Lipid and cholesterol levels. These may be checked every 5 years, or more frequently if you are over 71 years old.  Skin check.  Lung cancer screening. You may have this screening every year starting at age 71 if you have a 30-pack-year history of smoking and currently smoke or have quit within the past 15 years.  Fecal occult blood test (FOBT) of the stool. You may have this test every year starting at age 71.  Flexible sigmoidoscopy or colonoscopy. You may have a sigmoidoscopy every 5 years or a colonoscopy every 10 years starting at age 71.  Hepatitis C blood test.  Hepatitis B blood test.  Sexually transmitted disease (STD) testing.  Diabetes screening. This is done by checking your blood sugar (glucose) after you have not eaten for a while (fasting). You may have this done every 1-3 years.  Bone density scan. This is done to screen for osteoporosis. You may have this done starting at age 71.  Mammogram. This may be done every 1-2  years. Talk to your health care provider about how often you should have regular mammograms. Talk with your health care provider about your test results, treatment options, and if necessary, the need for more tests. Vaccines  Your health care provider may recommend  certain vaccines, such as:  Influenza vaccine. This is recommended every year.  Tetanus, diphtheria, and acellular pertussis (Tdap, Td) vaccine. You may need a Td booster every 10 years.  Zoster vaccine. You may need this after age 71.  Pneumococcal 13-valent conjugate (PCV13) vaccine. One dose is recommended after age 71.  Pneumococcal polysaccharide (PPSV23) vaccine. One dose is recommended after age 71. Talk to your health care provider about which screenings and vaccines you need and how often you need them. This information is not intended to replace advice given to you by your health care provider. Make sure you discuss any questions you have with your health care provider. Document Released: 06/13/2015 Document Revised: 02/04/2016 Document Reviewed: 03/18/2015 Elsevier Interactive Patient Education  2017 [employer] Prevention in the Home Falls can cause injuries. They can happen to people of all ages. There are many things you can do to make your home safe and to help prevent falls. What can I do on the outside of my home?  Regularly fix the edges of walkways and driveways and fix any cracks.  Remove anything that might make you trip as you walk through a door, such as a raised step or threshold.  Trim any bushes or trees on the path to your home.  Use bright outdoor lighting.  Clear any walking paths of anything that might make someone trip, such as rocks or tools.  Regularly check to see if handrails are loose or broken. Make sure that both sides of any steps have handrails.  Any raised decks and porches should have guardrails on the edges.  Have any leaves, snow, or ice cleared regularly.  Use sand or salt on walking paths during winter.  Clean up any spills in your garage right away. This includes oil or grease spills. What can I do in the bathroom?  Use night lights.  Install grab bars by the toilet and in the tub and shower. Do not use towel bars as  grab bars.  Use non-skid mats or decals in the tub or shower.  If you need to sit down in the shower, use a plastic, non-slip stool.  Keep the floor dry. Clean up any water that spills on the floor as soon as it happens.  Remove soap buildup in the tub or shower regularly.  Attach bath mats securely with double-sided non-slip rug tape.  Do not have throw rugs and other things on the floor that can make you trip. What can I do in the bedroom?  Use night lights.  Make sure that you have a light by your bed that is easy to reach.  Do not use any sheets or blankets that are too big for your bed. They should not hang down onto the floor.  Have a firm chair that has side arms. You can use this for support while you get dressed.  Do not have throw rugs and other things on the floor that can make you trip. What can I do in the kitchen?  Clean up any spills right away.  Avoid walking on wet floors.  Keep items that you use a lot in easy-to-reach places.  If you need to reach something above you, use a strong step  stool that has a grab bar.  Keep electrical cords out of the way.  Do not use floor polish or wax that makes floors slippery. If you must use wax, use non-skid floor wax.  Do not have throw rugs and other things on the floor that can make you trip. What can I do with my stairs?  Do not leave any items on the stairs.  Make sure that there are handrails on both sides of the stairs and use them. Fix handrails that are broken or loose. Make sure that handrails are as long as the stairways.  Check any carpeting to make sure that it is firmly attached to the stairs. Fix any carpet that is loose or worn.  Avoid having throw rugs at the top or bottom of the stairs. If you do have throw rugs, attach them to the floor with carpet tape.  Make sure that you have a light switch at the top of the stairs and the bottom of the stairs. If you do not have them, ask someone to add them  for you. What else can I do to help prevent falls?  Wear shoes that:  Do not have high heels.  Have rubber bottoms.  Are comfortable and fit you well.  Are closed at the toe. Do not wear sandals.  If you use a stepladder:  Make sure that it is fully opened. Do not climb a closed stepladder.  Make sure that both sides of the stepladder are locked into place.  Ask someone to hold it for you, if possible.  Clearly mark and make sure that you can see:  Any grab bars or handrails.  First and last steps.  Where the edge of each step is.  Use tools that help you move around (mobility aids) if they are needed. These include:  Canes.  Walkers.  Scooters.  Crutches.  Turn on the lights when you go into a dark area. Replace any light bulbs as soon as they burn out.  Set up your furniture so you have a clear path. Avoid moving your furniture around.  If any of your floors are uneven, fix them.  If there are any pets around you, be aware of where they are.  Review your medicines with your doctor. Some medicines can make you feel dizzy. This can increase your chance of falling. Ask your doctor what other things that you can do to help prevent falls. This information is not intended to replace advice given to you by your health care provider. Make sure you discuss any questions you have with your health care provider. Document Released: 03/13/2009 Document Revised: 10/23/2015 Document Reviewed: 06/21/2014 Elsevier Interactive Patient Education  2017 [employer].

## 2020-06-05 DIAGNOSIS — Z23 Encounter for immunization: Secondary | ICD-10-CM | POA: Diagnosis not present

## 2020-06-19 ENCOUNTER — Other Ambulatory Visit: Payer: Self-pay

## 2020-06-19 ENCOUNTER — Encounter: Payer: Self-pay | Admitting: Family Medicine

## 2020-06-19 DIAGNOSIS — E782 Mixed hyperlipidemia: Secondary | ICD-10-CM

## 2020-06-19 MED ORDER — EZETIMIBE 10 MG PO TABS: 10.0000 mg | ORAL_TABLET | Freq: Every day | ORAL | 0 refills | Status: DC

## 2020-08-06 DIAGNOSIS — M9901 Segmental and somatic dysfunction of cervical region: Secondary | ICD-10-CM | POA: Diagnosis not present

## 2020-08-06 DIAGNOSIS — M5412 Radiculopathy, cervical region: Secondary | ICD-10-CM | POA: Diagnosis not present

## 2020-08-06 DIAGNOSIS — M9902 Segmental and somatic dysfunction of thoracic region: Secondary | ICD-10-CM | POA: Diagnosis not present

## 2020-08-06 DIAGNOSIS — M62830 Muscle spasm of back: Secondary | ICD-10-CM | POA: Diagnosis not present

## 2020-08-11 DIAGNOSIS — M9902 Segmental and somatic dysfunction of thoracic region: Secondary | ICD-10-CM | POA: Diagnosis not present

## 2020-08-11 DIAGNOSIS — M5412 Radiculopathy, cervical region: Secondary | ICD-10-CM | POA: Diagnosis not present

## 2020-08-11 DIAGNOSIS — M9901 Segmental and somatic dysfunction of cervical region: Secondary | ICD-10-CM | POA: Diagnosis not present

## 2020-08-11 DIAGNOSIS — M62830 Muscle spasm of back: Secondary | ICD-10-CM | POA: Diagnosis not present

## 2020-08-25 DIAGNOSIS — M9902 Segmental and somatic dysfunction of thoracic region: Secondary | ICD-10-CM | POA: Diagnosis not present

## 2020-08-25 DIAGNOSIS — M5412 Radiculopathy, cervical region: Secondary | ICD-10-CM | POA: Diagnosis not present

## 2020-08-25 DIAGNOSIS — M9901 Segmental and somatic dysfunction of cervical region: Secondary | ICD-10-CM | POA: Diagnosis not present

## 2020-08-25 DIAGNOSIS — M62830 Muscle spasm of back: Secondary | ICD-10-CM | POA: Diagnosis not present

## 2020-09-12 ENCOUNTER — Other Ambulatory Visit: Payer: Self-pay | Admitting: Family Medicine

## 2020-09-12 DIAGNOSIS — E782 Mixed hyperlipidemia: Secondary | ICD-10-CM

## 2020-09-15 DIAGNOSIS — M9902 Segmental and somatic dysfunction of thoracic region: Secondary | ICD-10-CM | POA: Diagnosis not present

## 2020-09-15 DIAGNOSIS — M9901 Segmental and somatic dysfunction of cervical region: Secondary | ICD-10-CM | POA: Diagnosis not present

## 2020-09-15 DIAGNOSIS — M62830 Muscle spasm of back: Secondary | ICD-10-CM | POA: Diagnosis not present

## 2020-09-15 DIAGNOSIS — M5412 Radiculopathy, cervical region: Secondary | ICD-10-CM | POA: Diagnosis not present

## 2020-09-25 DIAGNOSIS — Z23 Encounter for immunization: Secondary | ICD-10-CM | POA: Diagnosis not present

## 2020-10-03 IMAGING — MG DIGITAL SCREENING BILATERAL MAMMOGRAM WITH TOMO AND CAD
8 series · 9 of 24 positions shown · non-contrast
Comparison: Previous exam(s).

CLINICAL DATA: Screening.

EXAM:
DIGITAL SCREENING BILATERAL MAMMOGRAM WITH TOMO AND CAD

[R MLO synth-2D]
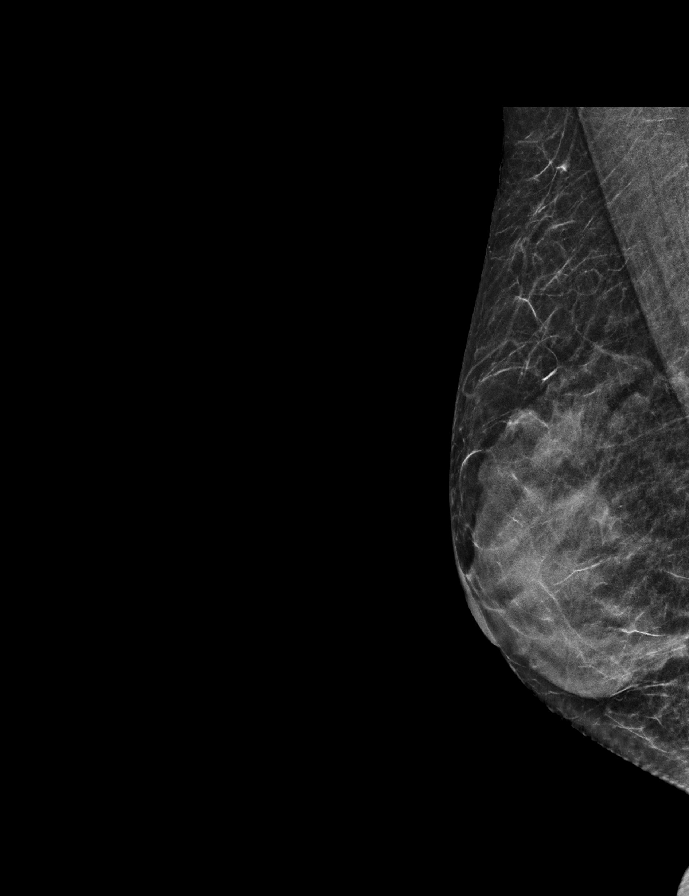

[L CC synth-2D]
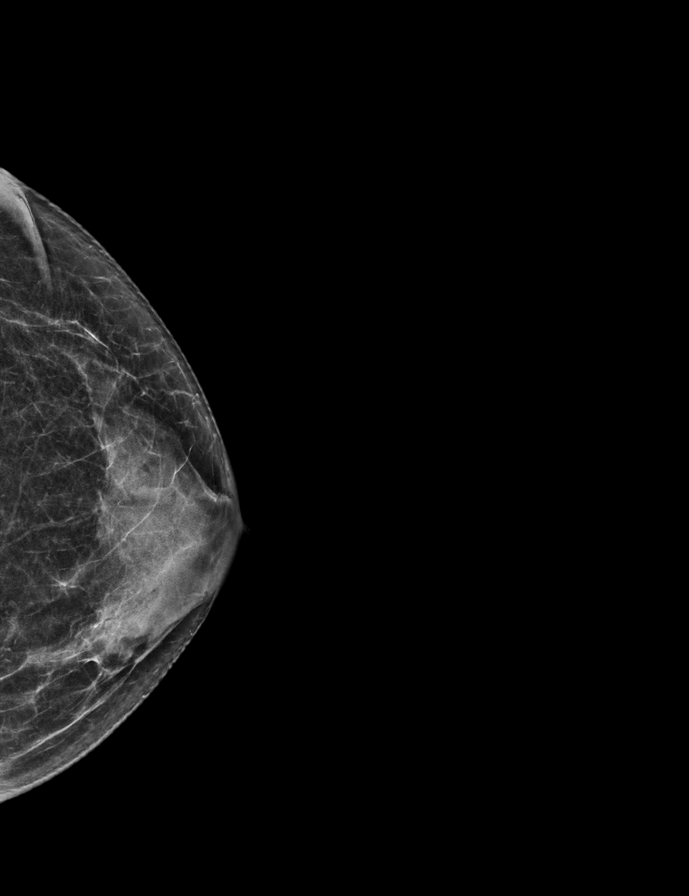

[R CC synth-2D]
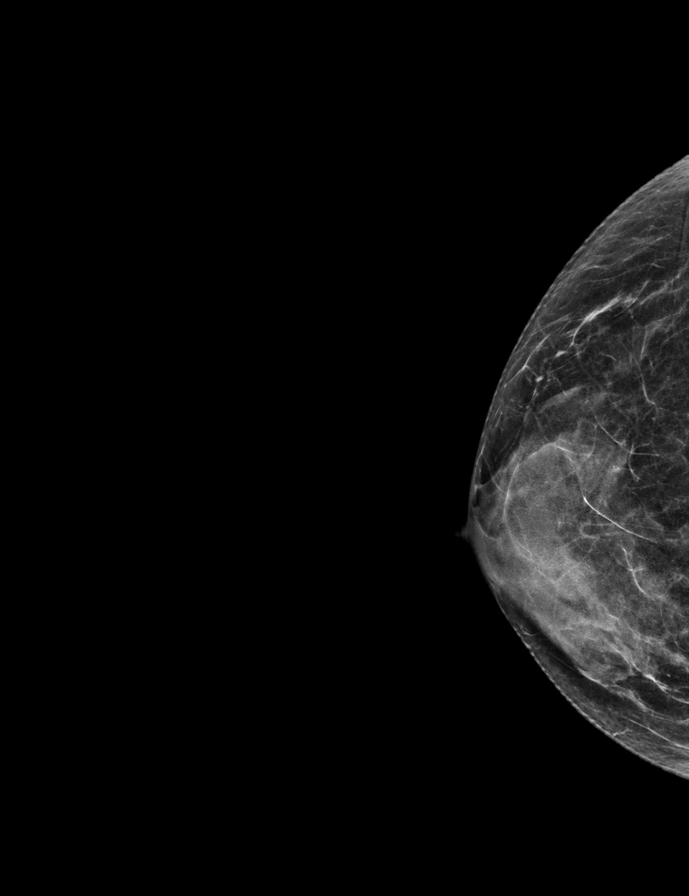

[L MLO synth-2D]
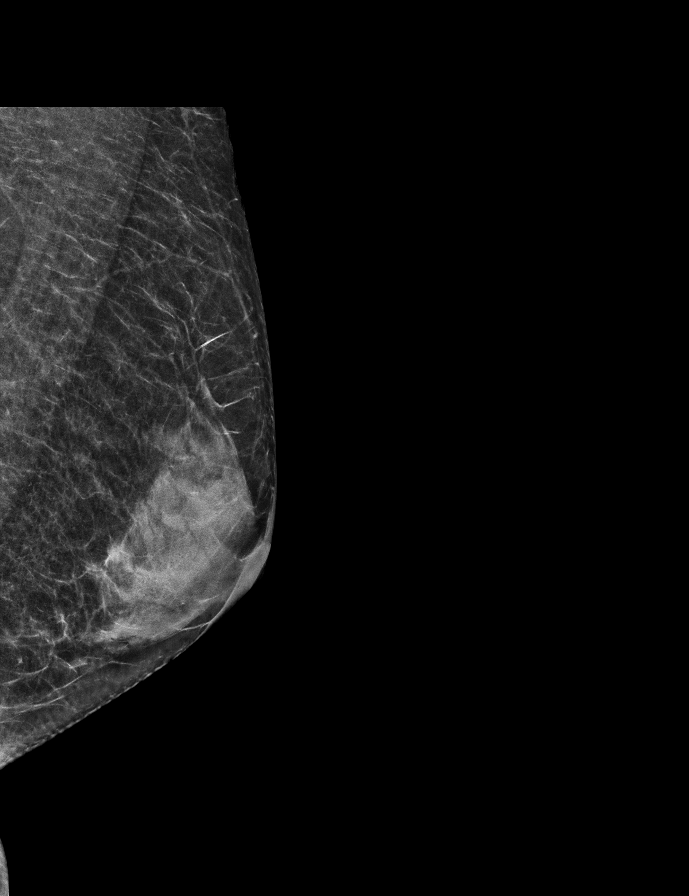

[L MLO tomo · 2 of 50 frames shown]
[frame 17/50]
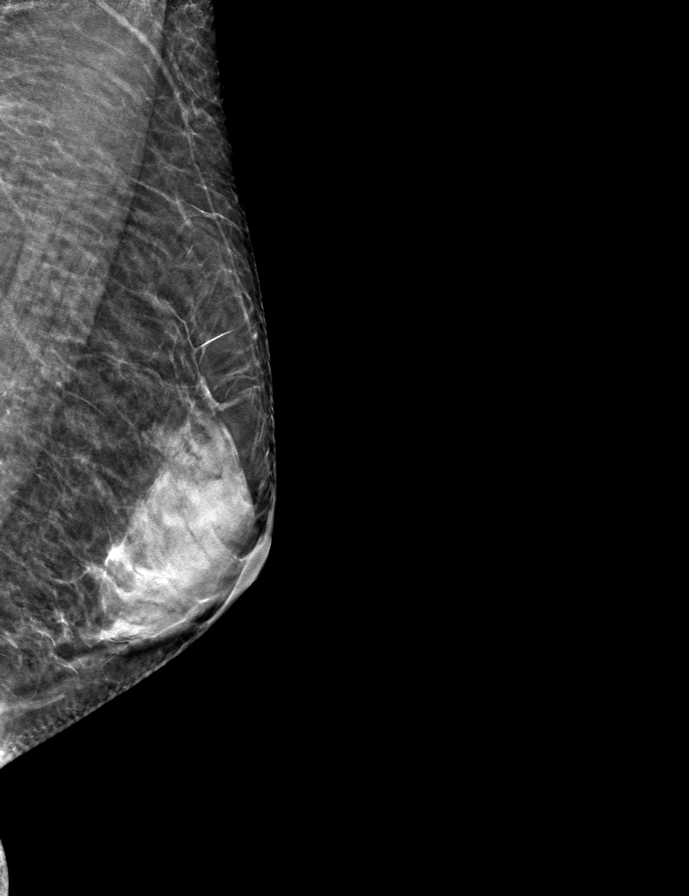
[frame 25/50]
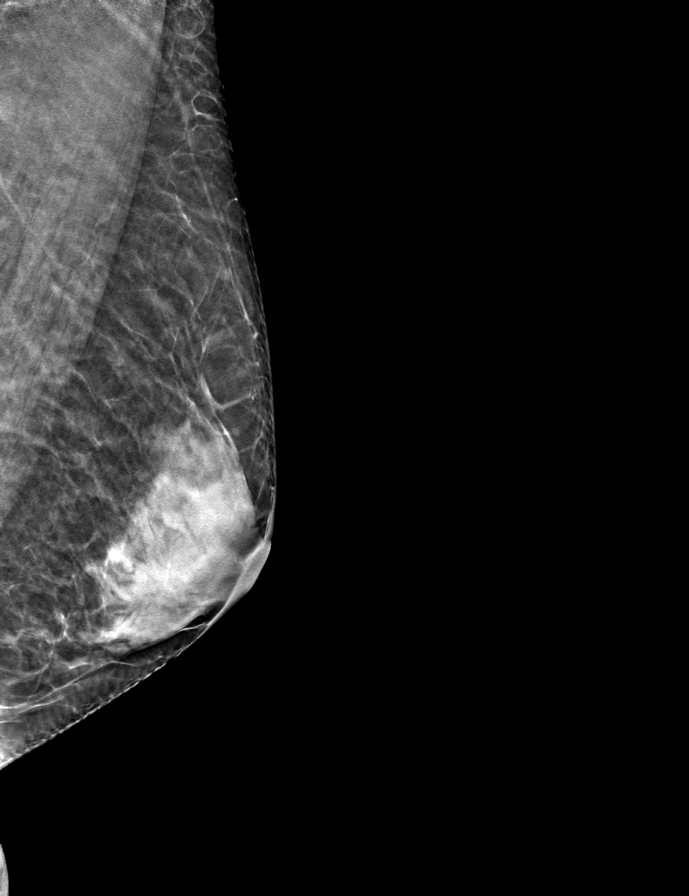

[R CC tomo · tomo slice 25/48.0]
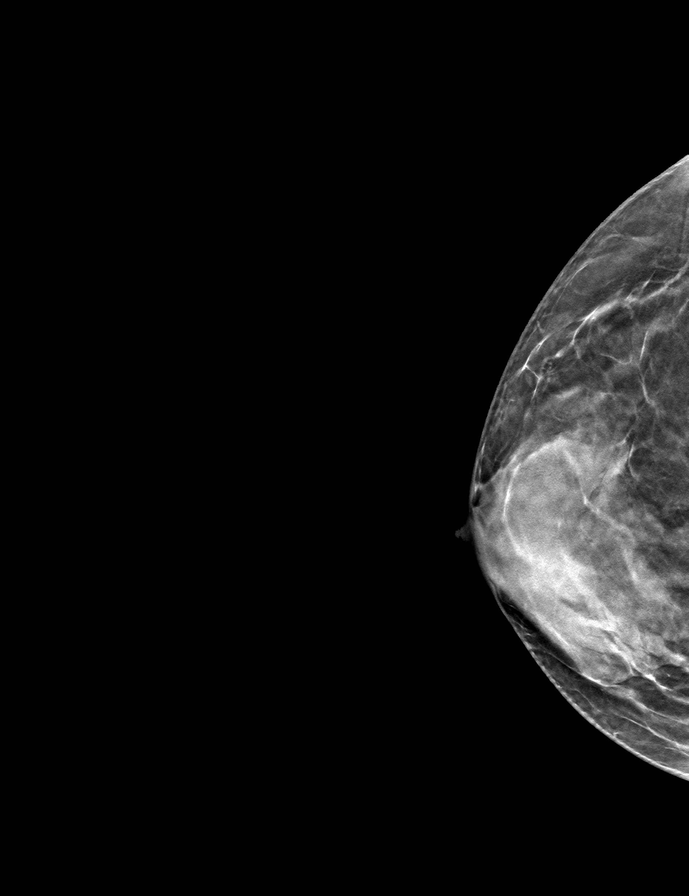

[R MLO tomo · tomo slice 24/47.0]
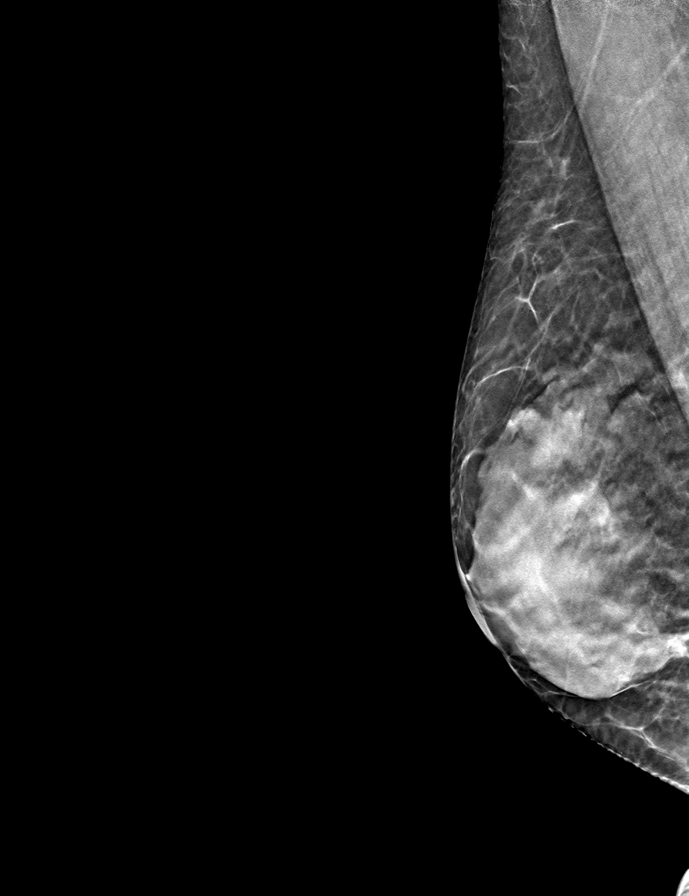

[L CC tomo · tomo slice 26/51.0]
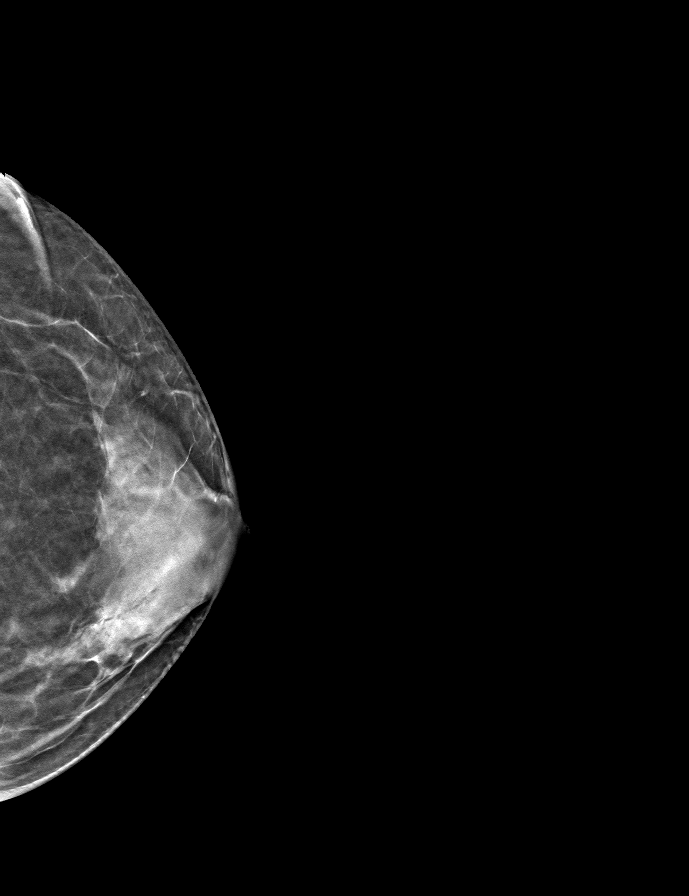

[9 of 24 positions shown; findings below may reference images not displayed]

ACR Breast Density Category c: The breast tissue is heterogeneously
dense, which may obscure small masses.
FINDINGS: There are no findings suspicious for malignancy. Images were
processed with CAD.
IMPRESSION: No mammographic evidence of malignancy. A result letter of this
screening mammogram will be mailed directly to the patient.

RECOMMENDATION:
Screening mammogram in one year. (Code:[id number]-[id number])

BI-RADS CATEGORY  1: Negative.

## 2020-10-06 DIAGNOSIS — M9901 Segmental and somatic dysfunction of cervical region: Secondary | ICD-10-CM | POA: Diagnosis not present

## 2020-10-06 DIAGNOSIS — M62830 Muscle spasm of back: Secondary | ICD-10-CM | POA: Diagnosis not present

## 2020-10-06 DIAGNOSIS — M5412 Radiculopathy, cervical region: Secondary | ICD-10-CM | POA: Diagnosis not present

## 2020-10-06 DIAGNOSIS — M9902 Segmental and somatic dysfunction of thoracic region: Secondary | ICD-10-CM | POA: Diagnosis not present

## 2020-10-16 ENCOUNTER — Encounter: Payer: Self-pay | Admitting: Family Medicine

## 2020-10-16 ENCOUNTER — Other Ambulatory Visit: Payer: Self-pay

## 2020-10-16 ENCOUNTER — Ambulatory Visit (INDEPENDENT_AMBULATORY_CARE_PROVIDER_SITE_OTHER): Payer: Medicare Other | Admitting: Family Medicine

## 2020-10-16 VITALS — BP 140/72 | HR 58 | Ht 64.0 in | Wt 122.4 lb

## 2020-10-16 DIAGNOSIS — E785 Hyperlipidemia, unspecified: Secondary | ICD-10-CM | POA: Diagnosis not present

## 2020-10-16 DIAGNOSIS — E1169 Type 2 diabetes mellitus with other specified complication: Secondary | ICD-10-CM

## 2020-10-16 NOTE — Progress Notes (Signed)
Established patient visit   Patient: Amanda Love   DOB: Jan 09, 1951   70 y.o. Female  MRN: [id number] Visit Date: 10/16/2020  Today's healthcare provider: Vernie Murders, PA-C   Chief Complaint  Patient presents with  . Hyperlipidemia   Subjective    HPI  Lipid/Cholesterol, Follow-up  Last lipid panel Other pertinent labs  Lab Results  Component Value Date   CHOL 185 04/18/2020   HDL 61 04/18/2020   LDLCALC 113 (H) 04/18/2020   TRIG 57 04/18/2020   CHOLHDL 3.0 04/18/2020   Lab Results  Component Value Date   ALT 41 (H) 04/18/2020   AST 35 04/18/2020   PLT 273 12/26/2019   TSH 3.230 12/26/2019     She was last seen for this 6 months ago.  Management since that visit includes - The Crestor (rosuvastatin) seems to be improving the cholesterol control. May go to QOD dosing.  She reports fair compliance with treatment. Patient quit taking the Ezetimibe but is taking the Rosuvastatin. She is having side effects. Muscle pain from Ezetimibe.  Symptoms: No chest pain No chest pressure/discomfort  No dyspnea No lower extremity edema  No numbness or tingling of extremity No orthopnea  No palpitations No paroxysmal nocturnal dyspnea  No speech difficulty No syncope   Current diet: vegetarian Current exercise: walking and Treadmill  The 10-year ASCVD risk score Mikey Bussing DC Brooke Bonito., et al., 2013) is: 12%  ---------------------------------------------------------------------------------------------------   Past Medical History:  Diagnosis Date  . Hyperlipidemia    No past surgical history on file. Social History   Tobacco Use  . Smoking status: Never Smoker  . Smokeless tobacco: Never Used  Vaping Use  . Vaping Use: Never used  Substance Use Topics  . Alcohol use: Not Currently   Family History  Problem Relation Age of Onset  . Breast cancer Neg Hx    No Known Allergies      Medications: Outpatient Medications Prior to Visit  Medication Sig  . ASCORBIC  ACID PO Take 500 mg by mouth 2 (two) times daily.   Marland Kitchen aspirin 325 MG tablet Take 325 mg by mouth daily.  . ASPIRIN 81 PO Take by mouth daily.  . Calcium Carbonate-Vit D-Min (CALTRATE 600+D PLUS PO) Take by mouth daily.   Marland Kitchen CALCIUM CARBONATE-VITAMIN D PO Take by mouth.  . cholecalciferol (VITAMIN D) 25 MCG (1000 UT) tablet Take 1,000 Units by mouth daily.  . Cholecalciferol (VITAMIN D3 MAXIMUM STRENGTH) 125 MCG (5000 UT) capsule Take 5,000 Units by mouth daily.  . Coenzyme Q10 10 MG capsule Take 300 mg by mouth daily.   . CYANOCOBALAMIN PO Take by mouth daily.  . hydrocortisone (ANUSOL-HC) 2.5 % rectal cream Place 1 application rectally 2 (two) times daily.  . Menaquinone-7 (VITAMIN K2) 100 MCG CAPS Take by mouth daily.  . Misc Natural Products (OSTEO BI-FLEX ADV TRIPLE ST PO) Take by mouth 2 (two) times daily.   . Multiple Vitamin (MULTIVITAMIN) tablet Take 1 tablet by mouth daily.  . rosuvastatin (CRESTOR) 5 MG tablet Take 1 tablet (5 mg total) by mouth daily.  . SUPER B COMPLEX/C PO Take by mouth daily.  . Turmeric 500 MG TABS Take by mouth daily.  Marland Kitchen zinc gluconate 50 MG tablet Take 50 mg by mouth daily.  Marland Kitchen ezetimibe (ZETIA) 10 MG tablet TAKE 1 TABLET (10 MG TOTAL) BY MOUTH DAILY. REMIND PATIENT TO GET LABS DONE. (Patient not taking: Reported on 10/16/2020)  . Probiotic Product ([employer] ADV PROBIOTIC  GUMMIES PO) Take by mouth daily. (Patient not taking: No sig reported)  . promethazine-dextromethorphan (PROMETHAZINE-DM) 6.25-15 MG/5ML syrup Take 5 mLs by mouth 2 (two) times daily as needed for cough. (Patient not taking: No sig reported)   No facility-administered medications prior to visit.    Review of Systems     Objective    BP 140/72 (BP Location: Left Arm, Patient Position: Sitting, Cuff Size: Small)   Pulse (!) 58   Ht 5\' 4"  (1.626 m)   Wt 122 lb 6.4 oz (55.5 kg)   SpO2 100%   BMI 21.01 kg/m  BP Readings from Last 3 Encounters:  10/16/20 140/72  12/26/19 124/82  10/26/18  118/72   Wt Readings from Last 3 Encounters:  10/16/20 122 lb 6.4 oz (55.5 kg)  03/28/20 114 lb (51.7 kg)  12/26/19 120 lb 9.6 oz (54.7 kg)       Physical Exam Constitutional:      General: She is not in acute distress.    Appearance: She is well-developed.  HENT:     Head: Normocephalic and atraumatic.     Right Ear: Hearing normal.     Left Ear: Hearing normal.     Nose: Nose normal.  Eyes:     General: Lids are normal. No scleral icterus.       Right eye: No discharge.        Left eye: No discharge.     Conjunctiva/sclera: Conjunctivae normal.  Cardiovascular:     Rate and Rhythm: Regular rhythm.     Heart sounds: Normal heart sounds.  Pulmonary:     Effort: Pulmonary effort is normal. No respiratory distress.     Breath sounds: Normal breath sounds.  Abdominal:     General: Bowel sounds are normal.     Palpations: Abdomen is soft.  Musculoskeletal:        General: Normal range of motion.  Skin:    Findings: No lesion or rash.  Neurological:     Mental Status: She is alert and oriented to person, place, and time.  Psychiatric:        Speech: Speech normal.        Behavior: Behavior normal.        Thought Content: Thought content normal.       No results found for any visits on 10/16/20.  Assessment & Plan    1. Hyperlipidemia associated with type 2 diabetes mellitus ([place]) Feeling well. Had some muscle pains with taking Zetia 10 mg qd with Crestor 5 mg qd. Stopped the Zetia and muscle pains improved. Continue Crestor 5 mg qd and recheck labs. - Lipid Panel With LDL/HDL Ratio   No follow-ups on file.      I, Ercia Crisafulli, PA-C, have reviewed all documentation for this visit. The documentation on 10/16/20 for the exam, diagnosis, procedures, and orders are all accurate and complete.    Vernie Murders, PA-C  [employer] [phone] (phone) [phone] (fax)  [place]

## 2020-10-17 LAB — LIPID PANEL WITH LDL/HDL RATIO
Cholesterol, Total: 192 mg/dL (ref 100–199)
HDL: 60 mg/dL (ref >39)
LDL Chol Calc (NIH): 119 mg/dL — ABNORMAL HIGH (ref 0–99)
LDL/HDL Ratio: 2 ratio (ref 0.0–3.2)
Triglycerides: 72 mg/dL (ref 0–149)
VLDL Cholesterol Cal: 13 mg/dL (ref 5–40)

## 2020-10-22 ENCOUNTER — Other Ambulatory Visit: Payer: Self-pay | Admitting: Family Medicine

## 2020-10-22 DIAGNOSIS — E782 Mixed hyperlipidemia: Secondary | ICD-10-CM

## 2020-10-22 MED ORDER — ROSUVASTATIN CALCIUM 5 MG PO TABS: ORAL_TABLET | ORAL | 3 refills | Status: DC

## 2020-10-28 DIAGNOSIS — M9902 Segmental and somatic dysfunction of thoracic region: Secondary | ICD-10-CM | POA: Diagnosis not present

## 2020-10-28 DIAGNOSIS — M62830 Muscle spasm of back: Secondary | ICD-10-CM | POA: Diagnosis not present

## 2020-10-28 DIAGNOSIS — M5412 Radiculopathy, cervical region: Secondary | ICD-10-CM | POA: Diagnosis not present

## 2020-10-28 DIAGNOSIS — M9901 Segmental and somatic dysfunction of cervical region: Secondary | ICD-10-CM | POA: Diagnosis not present

## 2020-11-06 ENCOUNTER — Other Ambulatory Visit: Payer: Self-pay

## 2020-11-06 ENCOUNTER — Ambulatory Visit (INDEPENDENT_AMBULATORY_CARE_PROVIDER_SITE_OTHER): Payer: Medicare Other | Admitting: Dermatology

## 2020-11-06 DIAGNOSIS — L84 Corns and callosities: Secondary | ICD-10-CM

## 2020-11-06 DIAGNOSIS — L820 Inflamed seborrheic keratosis: Secondary | ICD-10-CM

## 2020-11-06 DIAGNOSIS — L814 Other melanin hyperpigmentation: Secondary | ICD-10-CM

## 2020-11-06 NOTE — Patient Instructions (Signed)

## 2020-11-06 NOTE — Progress Notes (Signed)
   Follow-Up Visit   Subjective  Amanda Love is a 70 y.o. female who presents for the following: Other (Spots of forehead, and left 5th toe).  The following portions of the chart were reviewed this encounter and updated as appropriate:   Tobacco  Allergies  Meds  Problems  Med Hx  Surg Hx  Fam Hx      Review of Systems:  No other skin or systemic complaints except as noted in HPI or Assessment and Plan.  Objective  Well appearing patient in no apparent distress; mood and affect are within normal limits.  A focused examination was performed including face, left foot. Relevant physical exam findings are noted in the Assessment and Plan.  Left medial foread x 1, right lat brow x 1 - total 2 (2) Erythematous keratotic or waxy stuck-on papule or plaque.   Left 4th and 5th toe (2) Hyperkeratotic papule   Assessment & Plan  Inflamed seborrheic keratosis Left medial foread x 1, right lat brow x 1 - total 2  Destruction of lesion - Left medial foread x 1, right lat brow x 1 - total 2 Complexity: simple   Destruction method: cryotherapy   Informed consent: discussed and consent obtained   Timeout:  patient name, date of birth, surgical site, and procedure verified Lesion destroyed using liquid nitrogen: Yes   Region frozen until ice ball extended beyond lesion: Yes   Outcome: patient tolerated procedure well with no complications   Post-procedure details: wound care instructions given    Clavus (2) Left 4th and 5th toe  Recommend silicone toe separator   Destruction of lesion - Left 4th and 5th toe Complexity: simple   Destruction method: cryotherapy   Informed consent: discussed and consent obtained   Timeout:  patient name, date of birth, surgical site, and procedure verified Lesion destroyed using liquid nitrogen: Yes   Region frozen until ice ball extended beyond lesion: Yes   Outcome: patient tolerated procedure well with no complications   Post-procedure details:  wound care instructions given    Actinic Damage - chronic, secondary to cumulative UV radiation exposure/S[place]rinia exposure over time - diffuse scaly erythematous macules with underlying dyspigmentation - Recommend daily broad spectrum sunscreen SPF 30+ to [place]-exposed areas, reapply every 2 hours as needed.  - Recommend staying in the shade or wearing long sleeves, Nikhita glasses (UVA+UVB protection) and wide brim hats (4-inch brim around the entire circumference of the hat). - Call for new or changing lesions.  Return if symptoms worsen or fail to improve.  I, Ashok Cordia, CMA, am acting as scribe for Sarina Ser, MD .  Documentation: I have reviewed the above documentation for accuracy and completeness, and I agree with the above.  Sarina Ser, MD

## 2020-11-10 ENCOUNTER — Encounter: Payer: Self-pay | Admitting: Dermatology

## 2020-11-25 DIAGNOSIS — M9901 Segmental and somatic dysfunction of cervical region: Secondary | ICD-10-CM | POA: Diagnosis not present

## 2020-11-25 DIAGNOSIS — M9902 Segmental and somatic dysfunction of thoracic region: Secondary | ICD-10-CM | POA: Diagnosis not present

## 2020-11-25 DIAGNOSIS — M62830 Muscle spasm of back: Secondary | ICD-10-CM | POA: Diagnosis not present

## 2020-11-25 DIAGNOSIS — M5412 Radiculopathy, cervical region: Secondary | ICD-10-CM | POA: Diagnosis not present

## 2020-12-06 ENCOUNTER — Encounter: Payer: Self-pay | Admitting: Family Medicine

## 2020-12-06 ENCOUNTER — Other Ambulatory Visit: Payer: Self-pay | Admitting: Family Medicine

## 2020-12-06 DIAGNOSIS — U071 COVID-19: Secondary | ICD-10-CM

## 2020-12-06 MED ORDER — MOLNUPIRAVIR EUA 200MG CAPSULE: 4.0000 | ORAL_CAPSULE | Freq: Two times a day (BID) | ORAL | 0 refills | Status: AC

## 2020-12-06 NOTE — Progress Notes (Signed)
MyChart Message from patient's husband Shaneal Barasch that they both tested positive for Covid on 12/05/2020 started having symptoms 12/04/2020. Rx molnupiravir sent to [facility].

## 2020-12-23 DIAGNOSIS — M5412 Radiculopathy, cervical region: Secondary | ICD-10-CM | POA: Diagnosis not present

## 2020-12-23 DIAGNOSIS — M9901 Segmental and somatic dysfunction of cervical region: Secondary | ICD-10-CM | POA: Diagnosis not present

## 2020-12-23 DIAGNOSIS — M62830 Muscle spasm of back: Secondary | ICD-10-CM | POA: Diagnosis not present

## 2020-12-23 DIAGNOSIS — M9902 Segmental and somatic dysfunction of thoracic region: Secondary | ICD-10-CM | POA: Diagnosis not present

## 2021-01-19 DIAGNOSIS — M5412 Radiculopathy, cervical region: Secondary | ICD-10-CM | POA: Diagnosis not present

## 2021-01-19 DIAGNOSIS — M9901 Segmental and somatic dysfunction of cervical region: Secondary | ICD-10-CM | POA: Diagnosis not present

## 2021-01-19 DIAGNOSIS — M9902 Segmental and somatic dysfunction of thoracic region: Secondary | ICD-10-CM | POA: Diagnosis not present

## 2021-01-19 DIAGNOSIS — M62830 Muscle spasm of back: Secondary | ICD-10-CM | POA: Diagnosis not present

## 2021-02-16 DIAGNOSIS — M9901 Segmental and somatic dysfunction of cervical region: Secondary | ICD-10-CM | POA: Diagnosis not present

## 2021-02-16 DIAGNOSIS — M5412 Radiculopathy, cervical region: Secondary | ICD-10-CM | POA: Diagnosis not present

## 2021-02-16 DIAGNOSIS — M9902 Segmental and somatic dysfunction of thoracic region: Secondary | ICD-10-CM | POA: Diagnosis not present

## 2021-02-16 DIAGNOSIS — M62830 Muscle spasm of back: Secondary | ICD-10-CM | POA: Diagnosis not present

## 2021-03-16 DIAGNOSIS — M5412 Radiculopathy, cervical region: Secondary | ICD-10-CM | POA: Diagnosis not present

## 2021-03-16 DIAGNOSIS — M9902 Segmental and somatic dysfunction of thoracic region: Secondary | ICD-10-CM | POA: Diagnosis not present

## 2021-03-16 DIAGNOSIS — M9901 Segmental and somatic dysfunction of cervical region: Secondary | ICD-10-CM | POA: Diagnosis not present

## 2021-03-16 DIAGNOSIS — M62830 Muscle spasm of back: Secondary | ICD-10-CM | POA: Diagnosis not present

## 2021-03-19 DIAGNOSIS — Z23 Encounter for immunization: Secondary | ICD-10-CM | POA: Diagnosis not present

## 2021-04-06 ENCOUNTER — Ambulatory Visit: Payer: Medicare Other | Admitting: Physician Assistant

## 2021-04-08 ENCOUNTER — Other Ambulatory Visit: Payer: Self-pay

## 2021-04-08 ENCOUNTER — Ambulatory Visit (INDEPENDENT_AMBULATORY_CARE_PROVIDER_SITE_OTHER): Payer: Medicare Other | Admitting: Physician Assistant

## 2021-04-08 ENCOUNTER — Encounter: Payer: Self-pay | Admitting: Physician Assistant

## 2021-04-08 VITALS — BP 145/78 | HR 62 | Resp 15 | Wt 120.9 lb

## 2021-04-08 DIAGNOSIS — Z789 Other specified health status: Secondary | ICD-10-CM | POA: Insufficient documentation

## 2021-04-08 DIAGNOSIS — R030 Elevated blood-pressure reading, without diagnosis of hypertension: Secondary | ICD-10-CM

## 2021-04-08 DIAGNOSIS — E7841 Elevated Lipoprotein(a): Secondary | ICD-10-CM

## 2021-04-08 DIAGNOSIS — Z1231 Encounter for screening mammogram for malignant neoplasm of breast: Secondary | ICD-10-CM

## 2021-04-08 DIAGNOSIS — K5900 Constipation, unspecified: Secondary | ICD-10-CM | POA: Insufficient documentation

## 2021-04-08 MED ORDER — ROSUVASTATIN CALCIUM 5 MG PO TABS: ORAL_TABLET | ORAL | 3 refills | Status: DC

## 2021-04-08 NOTE — Assessment & Plan Note (Signed)
Reassured that stress can relate to constipation and other GI issues. Encouraged her to continue her hobbies and other methods of stress relief. We discussed trying Miralax or her herbal tea--but not daily, as needed if she does feel constipated or no BM in 2-3 days.

## 2021-04-08 NOTE — Assessment & Plan Note (Signed)
Today's reading and last visit 5/22 elevated. Pt states when she takes it at home it is consistently 120s/80s.  No plan to address this today, may be white coat HTN. We will reevaluate in 6 months, pt to continue taking at home.

## 2021-04-08 NOTE — Patient Instructions (Signed)
Can try Miralax OTC  But herbal teas okay as well just not every day

## 2021-04-08 NOTE — Assessment & Plan Note (Signed)
Recheck lipid panel + CMP for improvement on current regimen.  Refilled Crestor with unique sig-- pt will take 10 mg three times a week and 5 mg the other days. She will continue her current mainly vegetarian diet and exercise plans.

## 2021-04-08 NOTE — Progress Notes (Signed)
Established patient visit   Patient: Amanda Love   DOB: 1950-11-11   70 y.o. Female  MRN: [id number] Visit Date: 04/08/2021  Today's healthcare provider: Mikey Kirschner, PA-C   Chief Complaint  Patient presents with   Hyperlipidemia   Subjective    HPI  Lipid/Cholesterol, Follow-up  Last lipid panel Other pertinent labs  Lab Results  Component Value Date   CHOL 192 10/16/2020   HDL 60 10/16/2020   LDLCALC 119 (H) 10/16/2020   TRIG 72 10/16/2020   CHOLHDL 3.0 04/18/2020   Lab Results  Component Value Date   ALT 41 (H) 04/18/2020   AST 35 04/18/2020   PLT 273 12/26/2019   TSH 3.230 12/26/2019     She was last seen for this 6 months ago.  Management since that visit includes d/c Zetia and continue Crestor 5mg . She has an extensive history of multiple failed medications d/t myalgias. Recent treatment is Crestor 5 mg with 10 mg every 2-3 days. She has been doing this consistently for 6 months. No myalgias.  She reports fair compliance with treatment. She is not having side effects.   Symptoms: No chest pain No chest pressure/discomfort  No dyspnea No lower extremity edema  No numbness or tingling of extremity No orthopnea  No palpitations No paroxysmal nocturnal dyspnea  No speech difficulty No syncope   Current diet: vegetarian. Current exercise: running  The 10-year ASCVD risk score (Arnett DK, et al., 2019) is: 26.1%  ---------------------------------------------------------------------------------------------------  She also reports chronic constipation. She reports stress at home, which she manages with her many hobbies, exercise, meditation. She wonders if her constipation is related to stress. She has tried an herbal tea before with success, but does not want to rely on it to have BM. Denies abdominal pain, bloody BM.   Medications: Outpatient Medications Prior to Visit  Medication Sig   ASCORBIC ACID PO Take 500 mg by mouth 2 (two) times daily.     aspirin 325 MG tablet Take 325 mg by mouth daily.   ASPIRIN 81 PO Take by mouth daily.   Calcium Carbonate-Vit D-Min (CALTRATE 600+D PLUS PO) Take by mouth daily.    CALCIUM CARBONATE-VITAMIN D PO Take by mouth.   cholecalciferol (VITAMIN D) 25 MCG (1000 UT) tablet Take 1,000 Units by mouth daily.   Cholecalciferol (VITAMIN D3 MAXIMUM STRENGTH) 125 MCG (5000 UT) capsule Take 5,000 Units by mouth daily.   Coenzyme Q10 10 MG capsule Take 300 mg by mouth daily.    CYANOCOBALAMIN PO Take by mouth daily.   ezetimibe (ZETIA) 10 MG tablet TAKE 1 TABLET (10 MG TOTAL) BY MOUTH DAILY. REMIND PATIENT TO GET LABS DONE.   hydrocortisone (ANUSOL-HC) 2.5 % rectal cream Place 1 application rectally 2 (two) times daily.   Menaquinone-7 (VITAMIN K2) 100 MCG CAPS Take by mouth daily.   Misc Natural Products (OSTEO BI-FLEX ADV TRIPLE ST PO) Take by mouth 2 (two) times daily.    Multiple Vitamin (MULTIVITAMIN) tablet Take 1 tablet by mouth daily.   Probiotic Product ([facility] ADV PROBIOTIC GUMMIES PO) Take by mouth daily.   SUPER B COMPLEX/C PO Take by mouth daily.   Turmeric 500 MG TABS Take by mouth daily.   zinc gluconate 50 MG tablet Take 50 mg by mouth daily.   [DISCONTINUED] rosuvastatin (CRESTOR) 5 MG tablet Take 10 mg by mouth on Tuesday and Friday. Take 5 mg all other days of the week.   [DISCONTINUED] promethazine-dextromethorphan (PROMETHAZINE-DM) 6.25-15 MG/5ML syrup Take  5 mLs by mouth 2 (two) times daily as needed for cough. (Patient not taking: Reported on 04/08/2021)   No facility-administered medications prior to visit.    Review of Systems  Constitutional:  Negative for fatigue and fever.  Gastrointestinal:  Positive for constipation.  Musculoskeletal:  Negative for myalgias.  All other systems reviewed and are negative.     Objective    BP (!) 145/78   Pulse 62   Resp 15   Wt 120 lb 14.4 oz (54.8 kg)   SpO2 99%   BMI 20.75 kg/m   Physical Exam Constitutional:      General: She is  awake.     Appearance: She is well-developed.  HENT:     Head: Normocephalic.  Eyes:     Conjunctiva/sclera: Conjunctivae normal.  Cardiovascular:     Rate and Rhythm: Normal rate and regular rhythm.     Heart sounds: Normal heart sounds.  Pulmonary:     Effort: Pulmonary effort is normal.     Breath sounds: Normal breath sounds.  Skin:    General: Skin is warm.  Neurological:     Mental Status: She is alert and oriented to person, place, and time.  Psychiatric:        Attention and Perception: Attention normal.        Mood and Affect: Mood normal.        Speech: Speech normal.        Behavior: Behavior is cooperative.     No results found for any visits on 04/08/21.  Assessment & Plan     Problem List Items Addressed This Visit       Other   HLD (hyperlipidemia) - Primary    Recheck lipid panel + CMP for improvement on current regimen.  Refilled Crestor with unique sig-- pt will take 10 mg three times a week and 5 mg the other days. She will continue her current mainly vegetarian diet and exercise plans.      Relevant Medications   rosuvastatin (CRESTOR) 5 MG tablet   Other Relevant Orders   Lipid Profile   Comprehensive Metabolic Panel (CMET)   CBC   Elevated blood-pressure reading without diagnosis of hypertension    Today's reading and last visit 5/22 elevated. Pt states when she takes it at home it is consistently 120s/80s.  No plan to address this today, may be white coat HTN. We will reevaluate in 6 months, pt to continue taking at home.      Vegetarian    Pt states she takes B12 and multi vitamins among others. Will check CBC today to ensure not anemic.       Relevant Orders   CBC   Constipation    Reassured that stress can relate to constipation and other GI issues. Encouraged her to continue her hobbies and other methods of stress relief. We discussed trying Miralax or her herbal tea--but not daily, as needed if she does feel constipated or no BM  in 2-3 days.      Other Visit Diagnoses     Encounter for screening mammogram for malignant neoplasm of breast       Relevant Orders   MM Digital Screening        Return in about 6 months (around 10/06/2021) for hypertension, hyperlipidemia, constipation.      I, Mikey Kirschner, PA-C have reviewed all documentation for this visit. The documentation on  04/08/2021  for the exam, diagnosis, procedures, and orders are all accurate  and complete.    Mikey Kirschner, PA-C  [facility] [phone] (phone) [phone] (fax)  [place]

## 2021-04-08 NOTE — Assessment & Plan Note (Signed)
Pt states she takes B12 and multi vitamins among others. Will check CBC today to ensure not anemic.

## 2021-04-09 LAB — CBC
Hematocrit: 38.7 % (ref 34.0–46.6)
Hemoglobin: 12.8 g/dL (ref 11.1–15.9)
MCH: 31.6 pg (ref 26.6–33.0)
MCHC: 33.1 g/dL (ref 31.5–35.7)
MCV: 96 fL (ref 79–97)
Platelets: 279 10*3/uL (ref 150–450)
RBC: 4.05 x10E6/uL (ref 3.77–5.28)
RDW: 11.8 % (ref 11.7–15.4)
WBC: 3 10*3/uL — ABNORMAL LOW (ref 3.4–10.8)

## 2021-04-09 LAB — COMPREHENSIVE METABOLIC PANEL
ALT: 27 IU/L (ref 0–32)
AST: 30 IU/L (ref 0–40)
Albumin/Globulin Ratio: 2 (ref 1.2–2.2)
Albumin: 5 g/dL — ABNORMAL HIGH (ref 3.7–4.7)
Alkaline Phosphatase: 69 IU/L (ref 44–121)
BUN/Creatinine Ratio: 24 (ref 12–28)
BUN: 14 mg/dL (ref 8–27)
Bilirubin Total: 0.4 mg/dL (ref 0.0–1.2)
CO2: 21 mmol/L (ref 20–29)
Calcium: 9.6 mg/dL (ref 8.7–10.3)
Chloride: 101 mmol/L (ref 96–106)
Creatinine, Ser: 0.58 mg/dL (ref 0.57–1.00)
Globulin, Total: 2.5 g/dL (ref 1.5–4.5)
Glucose: 103 mg/dL — ABNORMAL HIGH (ref 70–99)
Potassium: 4.6 mmol/L (ref 3.5–5.2)
Sodium: 141 mmol/L (ref 134–144)
Total Protein: 7.5 g/dL (ref 6.0–8.5)
eGFR: 96 mL/min/{1.73_m2} (ref >59)

## 2021-04-09 LAB — LIPID PANEL
Chol/HDL Ratio: 3.5 ratio (ref 0.0–4.4)
Cholesterol, Total: 223 mg/dL — ABNORMAL HIGH (ref 100–199)
HDL: 64 mg/dL (ref >39)
LDL Chol Calc (NIH): 146 mg/dL — ABNORMAL HIGH (ref 0–99)
Triglycerides: 74 mg/dL (ref 0–149)
VLDL Cholesterol Cal: 13 mg/dL (ref 5–40)

## 2021-04-10 ENCOUNTER — Telehealth: Payer: Self-pay | Admitting: *Deleted

## 2021-04-10 NOTE — Telephone Encounter (Signed)
Patient returned call and reports she did get the message from provider: Cholesterol is up Unique statin protocol--she will increase her statin to 10 mg (2 tabs) three times a week and 5 mg (1 tab) four times a week We can recheck 6-8 weeks  Patient is so upset her levels did not improve. Patient states she is going to go up to 10 mg daily- she will contact provider if she has SE for options. Patient requests that provider [facility]- and she is aware that she need repeat labs in 6--8 weeks.

## 2021-04-13 ENCOUNTER — Other Ambulatory Visit: Payer: Self-pay | Admitting: Physician Assistant

## 2021-04-13 DIAGNOSIS — M5412 Radiculopathy, cervical region: Secondary | ICD-10-CM | POA: Diagnosis not present

## 2021-04-13 DIAGNOSIS — M9901 Segmental and somatic dysfunction of cervical region: Secondary | ICD-10-CM | POA: Diagnosis not present

## 2021-04-13 DIAGNOSIS — E7841 Elevated Lipoprotein(a): Secondary | ICD-10-CM

## 2021-04-13 DIAGNOSIS — M9902 Segmental and somatic dysfunction of thoracic region: Secondary | ICD-10-CM | POA: Diagnosis not present

## 2021-04-13 DIAGNOSIS — M62830 Muscle spasm of back: Secondary | ICD-10-CM | POA: Diagnosis not present

## 2021-04-13 MED ORDER — ROSUVASTATIN CALCIUM 10 MG PO TABS: 10.0000 mg | ORAL_TABLET | Freq: Every day | ORAL | 0 refills | Status: DC

## 2021-04-14 ENCOUNTER — Encounter: Payer: Self-pay | Admitting: Physician Assistant

## 2021-04-20 ENCOUNTER — Telehealth: Payer: Self-pay

## 2021-04-20 ENCOUNTER — Other Ambulatory Visit: Payer: Self-pay

## 2021-04-20 DIAGNOSIS — E7841 Elevated Lipoprotein(a): Secondary | ICD-10-CM

## 2021-04-20 NOTE — Telephone Encounter (Signed)
Copied from [place] [id number]. Topic: General - Inquiry >> Apr 20, 2021 11:05 AM Scherrie Gerlach wrote: Reason for CRM: pt instructed to repeat her labs in 6-8 weeks.  Pt will come by the office and pick up lab order near the end of Dec.

## 2021-04-30 ENCOUNTER — Other Ambulatory Visit: Payer: Self-pay

## 2021-04-30 ENCOUNTER — Ambulatory Visit
Admission: RE | Admit: 2021-04-30 | Discharge: 2021-04-30 | Disposition: A | Discharge status: Home / Self Care | Payer: Medicare Other | Source: Ambulatory Visit | Attending: Physician Assistant | Admitting: Physician Assistant

## 2021-04-30 DIAGNOSIS — Z1231 Encounter for screening mammogram for malignant neoplasm of breast: Secondary | ICD-10-CM | POA: Diagnosis not present

## 2021-05-11 DIAGNOSIS — M9902 Segmental and somatic dysfunction of thoracic region: Secondary | ICD-10-CM | POA: Diagnosis not present

## 2021-05-11 DIAGNOSIS — M62830 Muscle spasm of back: Secondary | ICD-10-CM | POA: Diagnosis not present

## 2021-05-11 DIAGNOSIS — M5412 Radiculopathy, cervical region: Secondary | ICD-10-CM | POA: Diagnosis not present

## 2021-05-11 DIAGNOSIS — M9901 Segmental and somatic dysfunction of cervical region: Secondary | ICD-10-CM | POA: Diagnosis not present

## 2021-05-26 DIAGNOSIS — E7841 Elevated Lipoprotein(a): Secondary | ICD-10-CM | POA: Diagnosis not present

## 2021-05-27 LAB — COMPREHENSIVE METABOLIC PANEL
ALT: 31 IU/L (ref 0–32)
AST: 27 IU/L (ref 0–40)
Albumin/Globulin Ratio: 1.7 (ref 1.2–2.2)
Albumin: 4.6 g/dL (ref 3.7–4.7)
Alkaline Phosphatase: 70 IU/L (ref 44–121)
BUN/Creatinine Ratio: 17 (ref 12–28)
BUN: 11 mg/dL (ref 8–27)
Bilirubin Total: 0.6 mg/dL (ref 0.0–1.2)
CO2: 25 mmol/L (ref 20–29)
Calcium: 9.7 mg/dL (ref 8.7–10.3)
Chloride: 105 mmol/L (ref 96–106)
Creatinine, Ser: 0.65 mg/dL (ref 0.57–1.00)
Globulin, Total: 2.7 g/dL (ref 1.5–4.5)
Glucose: 91 mg/dL (ref 70–99)
Potassium: 4 mmol/L (ref 3.5–5.2)
Sodium: 143 mmol/L (ref 134–144)
Total Protein: 7.3 g/dL (ref 6.0–8.5)
eGFR: 93 mL/min/{1.73_m2} (ref >59)

## 2021-05-27 LAB — LIPID PANEL
Chol/HDL Ratio: 3.4 ratio (ref 0.0–4.4)
Cholesterol, Total: 216 mg/dL — ABNORMAL HIGH (ref 100–199)
HDL: 63 mg/dL (ref >39)
LDL Chol Calc (NIH): 142 mg/dL — ABNORMAL HIGH (ref 0–99)
Triglycerides: 64 mg/dL (ref 0–149)
VLDL Cholesterol Cal: 11 mg/dL (ref 5–40)

## 2021-06-02 ENCOUNTER — Telehealth: Payer: Self-pay | Admitting: *Deleted

## 2021-06-02 NOTE — Telephone Encounter (Signed)
Patient returned call for results and advised:  Numbers are slightly improving, continue current regimen and repeat lipids / cmp in 6 months

## 2021-06-08 DIAGNOSIS — M62830 Muscle spasm of back: Secondary | ICD-10-CM | POA: Diagnosis not present

## 2021-06-08 DIAGNOSIS — M9901 Segmental and somatic dysfunction of cervical region: Secondary | ICD-10-CM | POA: Diagnosis not present

## 2021-06-08 DIAGNOSIS — M9902 Segmental and somatic dysfunction of thoracic region: Secondary | ICD-10-CM | POA: Diagnosis not present

## 2021-06-08 DIAGNOSIS — M5033 Other cervical disc degeneration, cervicothoracic region: Secondary | ICD-10-CM | POA: Diagnosis not present

## 2021-07-06 DIAGNOSIS — M5033 Other cervical disc degeneration, cervicothoracic region: Secondary | ICD-10-CM | POA: Diagnosis not present

## 2021-07-06 DIAGNOSIS — M62830 Muscle spasm of back: Secondary | ICD-10-CM | POA: Diagnosis not present

## 2021-07-06 DIAGNOSIS — M9901 Segmental and somatic dysfunction of cervical region: Secondary | ICD-10-CM | POA: Diagnosis not present

## 2021-07-06 DIAGNOSIS — M9902 Segmental and somatic dysfunction of thoracic region: Secondary | ICD-10-CM | POA: Diagnosis not present

## 2021-07-16 ENCOUNTER — Other Ambulatory Visit: Payer: Self-pay | Admitting: Physician Assistant

## 2021-07-16 DIAGNOSIS — E7841 Elevated Lipoprotein(a): Secondary | ICD-10-CM

## 2021-08-03 DIAGNOSIS — M62830 Muscle spasm of back: Secondary | ICD-10-CM | POA: Diagnosis not present

## 2021-08-03 DIAGNOSIS — M9902 Segmental and somatic dysfunction of thoracic region: Secondary | ICD-10-CM | POA: Diagnosis not present

## 2021-08-03 DIAGNOSIS — M9901 Segmental and somatic dysfunction of cervical region: Secondary | ICD-10-CM | POA: Diagnosis not present

## 2021-08-03 DIAGNOSIS — M5033 Other cervical disc degeneration, cervicothoracic region: Secondary | ICD-10-CM | POA: Diagnosis not present

## 2021-08-09 ENCOUNTER — Other Ambulatory Visit: Payer: Self-pay | Admitting: Physician Assistant

## 2021-08-09 DIAGNOSIS — E7841 Elevated Lipoprotein(a): Secondary | ICD-10-CM

## 2021-08-31 DIAGNOSIS — M9901 Segmental and somatic dysfunction of cervical region: Secondary | ICD-10-CM | POA: Diagnosis not present

## 2021-08-31 DIAGNOSIS — M5033 Other cervical disc degeneration, cervicothoracic region: Secondary | ICD-10-CM | POA: Diagnosis not present

## 2021-08-31 DIAGNOSIS — M62830 Muscle spasm of back: Secondary | ICD-10-CM | POA: Diagnosis not present

## 2021-08-31 DIAGNOSIS — M9902 Segmental and somatic dysfunction of thoracic region: Secondary | ICD-10-CM | POA: Diagnosis not present

## 2021-09-28 DIAGNOSIS — M5033 Other cervical disc degeneration, cervicothoracic region: Secondary | ICD-10-CM | POA: Diagnosis not present

## 2021-09-28 DIAGNOSIS — M62830 Muscle spasm of back: Secondary | ICD-10-CM | POA: Diagnosis not present

## 2021-09-28 DIAGNOSIS — M9901 Segmental and somatic dysfunction of cervical region: Secondary | ICD-10-CM | POA: Diagnosis not present

## 2021-09-28 DIAGNOSIS — M9902 Segmental and somatic dysfunction of thoracic region: Secondary | ICD-10-CM | POA: Diagnosis not present

## 2021-10-19 ENCOUNTER — Ambulatory Visit (INDEPENDENT_AMBULATORY_CARE_PROVIDER_SITE_OTHER): Payer: Medicare Other

## 2021-10-19 VITALS — Wt 120.0 lb

## 2021-10-19 DIAGNOSIS — Z0000 Encounter for general adult medical examination without abnormal findings: Secondary | ICD-10-CM

## 2021-10-19 DIAGNOSIS — Z1211 Encounter for screening for malignant neoplasm of colon: Secondary | ICD-10-CM | POA: Diagnosis not present

## 2021-10-19 NOTE — Progress Notes (Signed)
Virtual Visit via Telephone Note  I connected with  Amanda Love on 10/19/21 at 10:00 AM EDT by telephone and verified that I am speaking with the correct person using two identifiers.  Location: Patient: home Provider: [facility] Persons participating in the virtual visit: [place]   I discussed the limitations, risks, security and privacy concerns of performing an evaluation and management service by telephone and the availability of in person appointments. The patient expressed understanding and agreed to proceed.  Interactive audio and video telecommunications were attempted between this nurse and patient, however failed, due to patient having technical difficulties OR patient did not have access to video capability.  We continued and completed visit with audio only.  Some vital signs may be absent or patient reported.   Dionisio David, LPN  Subjective:   Amanda Love is a 71 y.o. female who presents for Medicare Annual (Subsequent) preventive examination.  Review of Systems           Objective:    There were no vitals filed for this visit. There is no height or weight on file to calculate BMI.     05/06/2020    9:53 AM 05/02/2019   10:59 AM  Advanced Directives  Does Patient Have a Medical Advance Directive? No No  Would patient like information on creating a medical advance directive? No - Patient declined No - Patient declined    Current Medications (verified) Outpatient Encounter Medications as of 10/19/2021  Medication Sig   ASCORBIC ACID PO Take 500 mg by mouth 2 (two) times daily.    aspirin 325 MG tablet Take 325 mg by mouth daily.   ASPIRIN 81 PO Take by mouth daily.   Calcium Carbonate-Vit D-Min (CALTRATE 600+D PLUS PO) Take by mouth daily.    CALCIUM CARBONATE-VITAMIN D PO Take by mouth.   cholecalciferol (VITAMIN D) 25 MCG (1000 UT) tablet Take 1,000 Units by mouth daily.   Cholecalciferol (VITAMIN D3 MAXIMUM STRENGTH) 125 MCG (5000 UT) capsule  Take 5,000 Units by mouth daily.   Coenzyme Q10 10 MG capsule Take 300 mg by mouth daily.    CYANOCOBALAMIN PO Take by mouth daily.   ezetimibe (ZETIA) 10 MG tablet TAKE 1 TABLET (10 MG TOTAL) BY MOUTH DAILY. REMIND PATIENT TO GET LABS DONE.   hydrocortisone (ANUSOL-HC) 2.5 % rectal cream Place 1 application rectally 2 (two) times daily.   Menaquinone-7 (VITAMIN K2) 100 MCG CAPS Take by mouth daily.   Misc Natural Products (OSTEO BI-FLEX ADV TRIPLE ST PO) Take by mouth 2 (two) times daily.    Multiple Vitamin (MULTIVITAMIN) tablet Take 1 tablet by mouth daily.   naproxen (NAPROSYN) 250 MG tablet PLEASE SEE ATTACHED FOR DETAILED DIRECTIONS   Probiotic Product ([facility] ADV PROBIOTIC GUMMIES PO) Take by mouth daily.   rosuvastatin (CRESTOR) 10 MG tablet TAKE 1 TABLET (10 MG TOTAL) BY MOUTH DAILY. TAKE 10 MG BY MOUTH ON THREE TIMES A WEEK. TAKE 5 MG ALL OTHER DAYS OF THE WEEK.   rosuvastatin (CRESTOR) 5 MG tablet Take by mouth.   SUPER B COMPLEX/C PO Take by mouth daily.   Turmeric 500 MG TABS Take by mouth daily.   zinc gluconate 50 MG tablet Take 50 mg by mouth daily.   No facility-administered encounter medications on file as of 10/19/2021.    Allergies (verified) Patient has no known allergies.   History: Past Medical History:  Diagnosis Date   Hyperlipidemia    No past surgical history on file. Family  History  Problem Relation Age of Onset   Breast cancer Neg Hx    Social History   Socioeconomic History   Marital status: Married    Spouse name: Not on file   Number of children: 1   Years of education: Not on file   Highest education level: Some college, no degree  Occupational History   Not on file  Tobacco Use   Smoking status: Never   Smokeless tobacco: Never  Vaping Use   Vaping Use: Never used  Substance and Sexual Activity   Alcohol use: Not Currently   Drug use: Not on file   Sexual activity: Not on file  Other Topics Concern   Not on file  Social History  Narrative   Not on file   Social Determinants of Health   Financial Resource Strain: Not on file  Food Insecurity: Not on file  Transportation Needs: Not on file  Physical Activity: Not on file  Stress: Not on file  Social Connections: Not on file    Tobacco Counseling Counseling given: Not Answered   Clinical Intake:  Pre-visit preparation completed: Yes  Pain : No/denies pain     Nutritional Risks: None Diabetes: No  How often do you need to have someone help you when you read instructions, pamphlets, or other written materials from your doctor or pharmacy?: 1 - Never  Diabetic?no  Interpreter Needed?: No  Information entered by :: Kirke Shaggy, LPN   Activities of Daily Living    10/15/2021    8:12 PM  In your present state of health, do you have any difficulty performing the following activities:  Hearing? 0  Vision? 0  Difficulty concentrating or making decisions? 0  Walking or climbing stairs? 0  Dressing or bathing? 0  Doing errands, shopping? 0  Preparing Food and eating ? N  Using the Toilet? N  In the past six months, have you accidently leaked urine? N  Do you have problems with loss of bowel control? N  Managing your Medications? N  Managing your Finances? N  Housekeeping or managing your Housekeeping? N    Patient Care Team: Mikey Kirschner, PA-C as PCP - General (Physician Assistant) Lorelee Cover., MD (Ophthalmology)  Indicate any recent Medical Services you may have received from other than [facility] providers in the past year (date may be approximate).     Assessment:   This is a routine wellness examination for Amanda Love.  Hearing/Vision screen No results found.  Dietary issues and exercise activities discussed:     Goals Addressed   None    Depression Screen    10/16/2020   10:02 AM 05/06/2020    9:51 AM 04/18/2020    8:15 AM 03/28/2020   10:00 AM 05/02/2019   11:00 AM 10/26/2018    9:53 AM 04/12/2017    2:22 PM  PHQ 2/9  Scores  PHQ - 2 Score 0 0 0 0 0 0 0  PHQ- 9 Score 0  0        Fall Risk    10/15/2021    8:12 PM 10/16/2020   10:02 AM 05/06/2020    9:53 AM 04/18/2020    8:15 AM 03/28/2020   10:00 AM  Fall Risk   Falls in the past year? 0 0 0 0 1  Number falls in past yr: 0 0 0 0 0  Injury with Fall? 0 0 0 0 0  Follow up    Falls evaluation completed  FALL RISK PREVENTION PERTAINING TO THE HOME:  Any stairs in or around the home? Yes  If so, are there any without handrails? No  Home free of loose throw rugs in walkways, pet beds, electrical cords, etc? Yes  Adequate lighting in your home to reduce risk of falls? Yes   ASSISTIVE DEVICES UTILIZED TO PREVENT FALLS:  Life alert? No  Use of a cane, walker or w/c? No  Grab bars in the bathroom? Yes  Shower chair or bench in shower? Yes  Elevated toilet seat or a handicapped toilet? No    Cognitive Function:        Immunizations Immunization History  Administered Date(s) Administered   Fluad Quad(high Dose 65+) 04/18/2020   Influenza, High Dose Seasonal PF 03/28/2018, 02/16/2019, 03/19/2021   Influenza-Unspecified 04/06/2017   PFIZER Comirnaty(Gray Top)Covid-19 Tri-Sucrose Vaccine 09/25/2020   PFIZER(Purple Top)SARS-COV-2 Vaccination 06/21/2019, 07/12/2019   Pfizer Covid-19 Vaccine Bivalent Booster 97yr & up 03/19/2021   Pneumococcal Conjugate-13 03/28/2018   Pneumococcal Polysaccharide-23 06/05/2020    TDAP status: Due, Education has been provided regarding the importance of this vaccine. Advised may receive this vaccine at local pharmacy or Health Dept. Aware to provide a copy of the vaccination record if obtained from local pharmacy or Health Dept. Verbalized acceptance and understanding.  Flu Vaccine status: Up to date  Pneumococcal vaccine status: Up to date  Covid-19 vaccine status: Completed vaccines  Qualifies for Shingles Vaccine? Yes   Zostavax completed No   Shingrix Completed?: No.    Education has been provided  regarding the importance of this vaccine. Patient has been advised to call insurance company to determine out of pocket expense if they have not yet received this vaccine. Advised may also receive vaccine at local pharmacy or Health Dept. Verbalized acceptance and understanding.  Screening Tests Health Maintenance  Topic Date Due   URINE MICROALBUMIN  Never done   TETANUS/TDAP  Never done   Zoster Vaccines- Shingrix (1 of 2) Never done   DEXA SCAN  Never done   COLONOSCOPY (Pts 45-456yrInsurance coverage will need to be confirmed)  08/09/2021   INFLUENZA VACCINE  12/29/2021   MAMMOGRAM  05/01/2023   Pneumonia Vaccine 689Years old  Completed   COVID-19 Vaccine  Completed   Hepatitis C Screening  Completed   HPV VACCINES  Aged Out    Health Maintenance  Health Maintenance Due  Topic Date Due   URINE MICROALBUMIN  Never done   TETANUS/TDAP  Never done   Zoster Vaccines- Shingrix (1 of 2) Never done   DEXA SCAN  Never done   COLONOSCOPY (Pts [id number] coverage will need to be confirmed)  08/09/2021    Colorectal cancer screening: Type of screening: Colonoscopy. Completed 08/10/11. Repeat every 10 years  Mammogram status: Completed 04/30/21. Repeat every year   Lung Cancer Screening: (Low Dose CT Chest recommended if Age 53-64-80ars, 30 pack-year currently smoking OR have quit w/in 15years.) does not qualify.    Additional Screening:  Hepatitis C Screening: does qualify; Completed 03/29/17  Vision Screening: Recommended annual ophthalmology exams for early detection of glaucoma and other disorders of the eye. Is the patient up to date with their annual eye exam?  Yes  Who is the provider or what is the name of the office in which the patient attends annual eye exams? Dr.Bell If pt is not established with a provider, would they like to be referred to a provider to establish care? No .   Dental Screening:  Recommended annual dental exams for proper oral  hygiene  Community Resource Referral / Chronic Care Management: CRR required this visit?  No   CCM required this visit?  No      Plan:     I have personally reviewed and noted the following in the patient's chart:   Medical and social history Use of alcohol, tobacco or illicit drugs  Current medications and supplements including opioid prescriptions.  Functional ability and status Nutritional status Physical activity Advanced directives List of other physicians Hospitalizations, surgeries, and ER visits in previous 12 months Vitals Screenings to include cognitive, depression, and falls Referrals and appointments  In addition, I have reviewed and discussed with patient certain preventive protocols, quality metrics, and best practice recommendations. A written personalized care plan for preventive services as well as general preventive health recommendations were provided to patient.     Dionisio David, LPN   [id number]   Nurse Notes: none

## 2021-10-19 NOTE — Patient Instructions (Signed)
Amanda Love , Thank you for taking time to come for your Medicare Wellness Visit. I appreciate your ongoing commitment to your health goals. Please review the following plan we discussed and let me know if I can assist you in the future.   Screening recommendations/referrals: Colonoscopy: 08/10/11, referral sent Mammogram: 05/01/21 Bone Density: declined Recommended yearly ophthalmology/optometry visit for glaucoma screening and checkup Recommended yearly dental visit for hygiene and checkup  Vaccinations: Influenza vaccine: 03/19/21 Pneumococcal vaccine: 06/15/20 Tdap vaccine: n.d Shingles vaccine: n/d   Covid-19:06/02/19, 07/12/19, 03/19/21, 09/25/20  Advanced directives: no  Conditions/risks identified: none  Next appointment: Follow up in one year for your annual wellness visit 10/21/22 @ 10:15am by phone   Preventive Care 30 Years and Older, Female Preventive care refers to lifestyle choices and visits with your health care provider that can promote health and wellness. What does preventive care include? A yearly physical exam. This is also called an annual well check. Dental exams once or twice a year. Routine eye exams. Ask your health care provider how often you should have your eyes checked. Personal lifestyle choices, including: Daily care of your teeth and gums. Regular physical activity. Eating a healthy diet. Avoiding tobacco and drug use. Limiting alcohol use. Practicing safe sex. Taking low-dose aspirin every day. Taking vitamin and mineral supplements as recommended by your health care provider. What happens during an annual well check? The services and screenings done by your health care provider during your annual well check will depend on your age, overall health, lifestyle risk factors, and family history of disease. Counseling  Your health care provider may ask you questions about your: Alcohol use. Tobacco use. Drug use. Emotional well-being. Home and  relationship well-being. Sexual activity. Eating habits. History of falls. Memory and ability to understand (cognition). Work and work Statistician. Reproductive health. Screening  You may have the following tests or measurements: Height, weight, and BMI. Blood pressure. Lipid and cholesterol levels. These may be checked every 5 years, or more frequently if you are over 54 years old. Skin check. Lung cancer screening. You may have this screening every year starting at age 35 if you have a 30-pack-year history of smoking and currently smoke or have quit within the past 15 years. Fecal occult blood test (FOBT) of the stool. You may have this test every year starting at age 79. Flexible sigmoidoscopy or colonoscopy. You may have a sigmoidoscopy every 5 years or a colonoscopy every 10 years starting at age 46. Hepatitis C blood test. Hepatitis B blood test. Sexually transmitted disease (STD) testing. Diabetes screening. This is done by checking your blood sugar (glucose) after you have not eaten for a while (fasting). You may have this done every 1-3 years. Bone density scan. This is done to screen for osteoporosis. You may have this done starting at age 91. Mammogram. This may be done every 1-2 years. Talk to your health care provider about how often you should have regular mammograms. Talk with your health care provider about your test results, treatment options, and if necessary, the need for more tests. Vaccines  Your health care provider may recommend certain vaccines, such as: Influenza vaccine. This is recommended every year. Tetanus, diphtheria, and acellular pertussis (Tdap, Td) vaccine. You may need a Td booster every 10 years. Zoster vaccine. You may need this after age 57. Pneumococcal 13-valent conjugate (PCV13) vaccine. One dose is recommended after age 41. Pneumococcal polysaccharide (PPSV23) vaccine. One dose is recommended after age 15. Talk to your  health care provider  about which screenings and vaccines you need and how often you need them. This information is not intended to replace advice given to you by your health care provider. Make sure you discuss any questions you have with your health care provider. Document Released: 06/13/2015 Document Revised: 02/04/2016 Document Reviewed: 03/18/2015 Elsevier Interactive Patient Education  2017 [place] Prevention in the Home Falls can cause injuries. They can happen to people of all ages. There are many things you can do to make your home safe and to help prevent falls. What can I do on the outside of my home? Regularly fix the edges of walkways and driveways and fix any cracks. Remove anything that might make you trip as you walk through a door, such as a raised step or threshold. Trim any bushes or trees on the path to your home. Use bright outdoor lighting. Clear any walking paths of anything that might make someone trip, such as rocks or tools. Regularly check to see if handrails are loose or broken. Make sure that both sides of any steps have handrails. Any raised decks and porches should have guardrails on the edges. Have any leaves, snow, or ice cleared regularly. Use sand or salt on walking paths during winter. Clean up any spills in your garage right away. This includes oil or grease spills. What can I do in the bathroom? Use night lights. Install grab bars by the toilet and in the tub and shower. Do not use towel bars as grab bars. Use non-skid mats or decals in the tub or shower. If you need to sit down in the shower, use a plastic, non-slip stool. Keep the floor dry. Clean up any water that spills on the floor as soon as it happens. Remove soap buildup in the tub or shower regularly. Attach bath mats securely with double-sided non-slip rug tape. Do not have throw rugs and other things on the floor that can make you trip. What can I do in the bedroom? Use night lights. Make sure  that you have a light by your bed that is easy to reach. Do not use any sheets or blankets that are too big for your bed. They should not hang down onto the floor. Have a firm chair that has side arms. You can use this for support while you get dressed. Do not have throw rugs and other things on the floor that can make you trip. What can I do in the kitchen? Clean up any spills right away. Avoid walking on wet floors. Keep items that you use a lot in easy-to-reach places. If you need to reach something above you, use a strong step stool that has a grab bar. Keep electrical cords out of the way. Do not use floor polish or wax that makes floors slippery. If you must use wax, use non-skid floor wax. Do not have throw rugs and other things on the floor that can make you trip. What can I do with my stairs? Do not leave any items on the stairs. Make sure that there are handrails on both sides of the stairs and use them. Fix handrails that are broken or loose. Make sure that handrails are as long as the stairways. Check any carpeting to make sure that it is firmly attached to the stairs. Fix any carpet that is loose or worn. Avoid having throw rugs at the top or bottom of the stairs. If you do have throw rugs, attach them  to the floor with carpet tape. Make sure that you have a light switch at the top of the stairs and the bottom of the stairs. If you do not have them, ask someone to add them for you. What else can I do to help prevent falls? Wear shoes that: Do not have high heels. Have rubber bottoms. Are comfortable and fit you well. Are closed at the toe. Do not wear sandals. If you use a stepladder: Make sure that it is fully opened. Do not climb a closed stepladder. Make sure that both sides of the stepladder are locked into place. Ask someone to hold it for you, if possible. Clearly mark and make sure that you can see: Any grab bars or handrails. First and last steps. Where the edge of  each step is. Use tools that help you move around (mobility aids) if they are needed. These include: Canes. Walkers. Scooters. Crutches. Turn on the lights when you go into a dark area. Replace any light bulbs as soon as they burn out. Set up your furniture so you have a clear path. Avoid moving your furniture around. If any of your floors are uneven, fix them. If there are any pets around you, be aware of where they are. Review your medicines with your doctor. Some medicines can make you feel dizzy. This can increase your chance of falling. Ask your doctor what other things that you can do to help prevent falls. This information is not intended to replace advice given to you by your health care provider. Make sure you discuss any questions you have with your health care provider. Document Released: 03/13/2009 Document Revised: 10/23/2015 Document Reviewed: 06/21/2014 Elsevier Interactive Patient Education  2017 [employer].

## 2021-10-20 ENCOUNTER — Telehealth: Payer: Self-pay

## 2021-10-20 ENCOUNTER — Other Ambulatory Visit: Payer: Self-pay

## 2021-10-20 DIAGNOSIS — Z1211 Encounter for screening for malignant neoplasm of colon: Secondary | ICD-10-CM

## 2021-10-20 MED ORDER — NA SULFATE-K SULFATE-MG SULF 17.5-3.13-1.6 GM/177ML PO SOLN: 1.0000 | Freq: Once | ORAL | 0 refills | Status: AC

## 2021-10-20 NOTE — Telephone Encounter (Signed)
Gastroenterology Pre-Procedure Review  Request Date: 11/16/21 Requesting Physician: Dr. Marius Ditch  PATIENT REVIEW QUESTIONS: The patient responded to the following health history questions as indicated:    1. Are you having any GI issues? no 2. Do you have a personal history of Polyps? no 3. Do you have a family history of Colon Cancer or Polyps? no 4. Diabetes Mellitus? no 5. Joint replacements in the past 12 months?no 6. Major health problems in the past 3 months?no 7. Any artificial heart valves, MVP, or defibrillator?no    MEDICATIONS & ALLERGIES:    Patient reports the following regarding taking any anticoagulation/antiplatelet therapy:   Plavix, Coumadin, Eliquis, Xarelto, Lovenox, Pradaxa, Brilinta, or Effient? no Aspirin? yes ('325mg'$  daily advised to decrease to '81mg'$  5 days  before) '325mg'$  is not prescribed by physician pt started this on her own.  Patient confirms/reports the following medications:  Current Outpatient Medications  Medication Sig Dispense Refill   ASCORBIC ACID PO Take 500 mg by mouth 2 (two) times daily.      aspirin 325 MG tablet Take 325 mg by mouth daily. (Patient not taking: Reported on 10/19/2021)     ASPIRIN 81 PO Take by mouth daily.     Calcium Carbonate-Vit D-Min (CALTRATE 600+D PLUS PO) Take by mouth daily.      CALCIUM CARBONATE-VITAMIN D PO Take by mouth.     cholecalciferol (VITAMIN D) 25 MCG (1000 UT) tablet Take 1,000 Units by mouth daily.     Cholecalciferol (VITAMIN D3 MAXIMUM STRENGTH) 125 MCG (5000 UT) capsule Take 5,000 Units by mouth daily.     Coenzyme Q10 10 MG capsule Take 300 mg by mouth daily.      CYANOCOBALAMIN PO Take by mouth daily.     ezetimibe (ZETIA) 10 MG tablet TAKE 1 TABLET (10 MG TOTAL) BY MOUTH DAILY. REMIND PATIENT TO GET LABS DONE. (Patient not taking: Reported on 10/19/2021) 90 tablet 0   hydrocortisone (ANUSOL-HC) 2.5 % rectal cream Place 1 application rectally 2 (two) times daily. 30 g 0   Menaquinone-7 (VITAMIN K2) 100  MCG CAPS Take by mouth daily.     Misc Natural Products (OSTEO BI-FLEX ADV TRIPLE ST PO) Take by mouth 2 (two) times daily.      Multiple Vitamin (MULTIVITAMIN) tablet Take 1 tablet by mouth daily.     naproxen (NAPROSYN) 250 MG tablet PLEASE SEE ATTACHED FOR DETAILED DIRECTIONS     Probiotic Product ([employer] ADV PROBIOTIC GUMMIES PO) Take by mouth daily.     rosuvastatin (CRESTOR) 10 MG tablet TAKE 1 TABLET (10 MG TOTAL) BY MOUTH DAILY. TAKE 10 MG BY MOUTH ON THREE TIMES A WEEK. TAKE 5 MG ALL OTHER DAYS OF THE WEEK. 90 tablet 1   rosuvastatin (CRESTOR) 5 MG tablet Take by mouth.     SUPER B COMPLEX/C PO Take by mouth daily.     Turmeric 500 MG TABS Take by mouth daily.     zinc gluconate 50 MG tablet Take 50 mg by mouth daily.     No current facility-administered medications for this visit.    Patient confirms/reports the following allergies:  No Known Allergies  No orders of the defined types were placed in this encounter.   AUTHORIZATION INFORMATION Primary Insurance: 1D#: Group #:  Secondary Insurance: 1D#: Group #:  SCHEDULE INFORMATION: Date: 11/16/21 Time: Location: [facility]

## 2021-11-02 DIAGNOSIS — M9901 Segmental and somatic dysfunction of cervical region: Secondary | ICD-10-CM | POA: Diagnosis not present

## 2021-11-02 DIAGNOSIS — M9902 Segmental and somatic dysfunction of thoracic region: Secondary | ICD-10-CM | POA: Diagnosis not present

## 2021-11-02 DIAGNOSIS — M62830 Muscle spasm of back: Secondary | ICD-10-CM | POA: Diagnosis not present

## 2021-11-02 DIAGNOSIS — M5033 Other cervical disc degeneration, cervicothoracic region: Secondary | ICD-10-CM | POA: Diagnosis not present

## 2021-11-12 ENCOUNTER — Telehealth: Payer: Self-pay

## 2021-11-12 NOTE — Telephone Encounter (Signed)
Patient had a concern we answered and patient is good now

## 2021-11-16 ENCOUNTER — Ambulatory Visit
Admission: RE | Admit: 2021-11-16 | Discharge: 2021-11-16 | Disposition: A | Discharge status: Home / Self Care | Payer: Medicare Other | Source: Ambulatory Visit | Attending: Gastroenterology | Admitting: Gastroenterology

## 2021-11-16 ENCOUNTER — Ambulatory Visit: Payer: Medicare Other | Admitting: Certified Registered Nurse Anesthetist

## 2021-11-16 ENCOUNTER — Encounter: Payer: Self-pay | Admitting: Gastroenterology

## 2021-11-16 ENCOUNTER — Encounter
Admission: RE | Disposition: A | Discharge status: Home / Self Care | Payer: Self-pay | Source: Ambulatory Visit | Attending: Gastroenterology

## 2021-11-16 DIAGNOSIS — E785 Hyperlipidemia, unspecified: Secondary | ICD-10-CM | POA: Diagnosis not present

## 2021-11-16 DIAGNOSIS — D123 Benign neoplasm of transverse colon: Secondary | ICD-10-CM | POA: Insufficient documentation

## 2021-11-16 DIAGNOSIS — Z1211 Encounter for screening for malignant neoplasm of colon: Secondary | ICD-10-CM | POA: Diagnosis not present

## 2021-11-16 DIAGNOSIS — K635 Polyp of colon: Secondary | ICD-10-CM | POA: Diagnosis not present

## 2021-11-16 HISTORY — PX: COLONOSCOPY WITH PROPOFOL: SHX5780

## 2021-11-16 SURGERY — COLONOSCOPY WITH PROPOFOL
Anesthesia: General

## 2021-11-16 MED ORDER — SODIUM CHLORIDE 0.9 % IV SOLN: INTRAVENOUS | Status: DC

## 2021-11-16 MED ORDER — PROPOFOL 500 MG/50ML IV EMUL
INTRAVENOUS | Status: DC | PRN
  Administered 2021-11-16: 160 ug/kg/min via INTRAVENOUS

## 2021-11-16 MED ORDER — PROPOFOL 10 MG/ML IV BOLUS
INTRAVENOUS | Status: DC | PRN
  Administered 2021-11-16: 50 mg via INTRAVENOUS
  Administered 2021-11-16: 20 mg via INTRAVENOUS

## 2021-11-16 NOTE — H&P (Signed)
Cephas Darby, MD [address]  [place]  [place], [place] [address]  Main: [phone]  Fax: [phone] Pager: [phone]  Primary Care Physician:  Mikey Kirschner, PA-C Primary Gastroenterologist:  Dr. Cephas Darby  Pre-Procedure History & Physical: HPI:  Amanda Love is a 71 y.o. female is here for an colonoscopy.   Past Medical History:  Diagnosis Date   Hyperlipidemia     Past Surgical History:  Procedure Laterality Date   COLONOSCOPY WITH PROPOFOL     EVALUATION UNDER ANESTHESIA WITH HEMORRHOIDECTOMY AND PROCTOSCOPY      Prior to Admission medications   Medication Sig Start Date End Date Taking? Authorizing Provider  ASPIRIN 81 PO Take by mouth daily.   Yes [provider]  ASCORBIC ACID PO Take 500 mg by mouth 2 (two) times daily.     [provider]  aspirin 325 MG tablet Take 325 mg by mouth daily.    [provider]  Calcium Carbonate-Vit D-Min (CALTRATE 600+D PLUS PO) Take by mouth daily.     [provider]  CALCIUM CARBONATE-VITAMIN D PO Take by mouth.    [provider]  cholecalciferol (VITAMIN D) 25 MCG (1000 UT) tablet Take 1,000 Units by mouth daily.    [provider]  Cholecalciferol (VITAMIN D3 MAXIMUM STRENGTH) 125 MCG (5000 UT) capsule Take 5,000 Units by mouth daily.    [provider]  Coenzyme Q10 10 MG capsule Take 300 mg by mouth daily.     [provider]  CYANOCOBALAMIN PO Take by mouth daily.    [provider]  ezetimibe (ZETIA) 10 MG tablet TAKE 1 TABLET (10 MG TOTAL) BY MOUTH DAILY. REMIND PATIENT TO GET LABS DONE. Patient not taking: Reported on 10/19/2021 09/12/20   Chrismon, Vickki Muff, PA-C  hydrocortisone (ANUSOL-HC) 2.5 % rectal cream Place 1 application rectally 2 (two) times daily. 12/26/19   Flinchum, Kelby Aline, FNP  Menaquinone-7 (VITAMIN K2) 100 MCG CAPS Take by mouth daily.    [provider]  Misc Natural Products (OSTEO BI-FLEX ADV  TRIPLE ST PO) Take by mouth 2 (two) times daily.     [provider]  Multiple Vitamin (MULTIVITAMIN) tablet Take 1 tablet by mouth daily.    [provider]  naproxen (NAPROSYN) 250 MG tablet PLEASE SEE ATTACHED FOR DETAILED DIRECTIONS 07/16/21   [provider]  Probiotic Product ([employer]S ADV PROBIOTIC GUMMIES PO) Take by mouth daily.    [provider]  rosuvastatin (CRESTOR) 10 MG tablet TAKE 1 TABLET (10 MG TOTAL) BY MOUTH DAILY. TAKE 10 MG BY MOUTH ON THREE TIMES A WEEK. TAKE 5 MG ALL OTHER DAYS OF THE WEEK. 08/10/21 11/08/21  Mikey Kirschner, PA-C  rosuvastatin (CRESTOR) 5 MG tablet Take by mouth. 04/24/21   [provider]  SUPER B COMPLEX/C PO Take by mouth daily.    [provider]  Turmeric 500 MG TABS Take by mouth daily.    [provider]  zinc gluconate 50 MG tablet Take 50 mg by mouth daily.    [provider]    Allergies as of 10/20/2021   (No Known Allergies)    Family History  Problem Relation Age of Onset   Breast cancer Neg Hx     Social History   Socioeconomic History   Marital status: Married    Spouse name: Not on file   Number of children: 1   Years of education: Not on file   Highest education level:  Some college, no degree  Occupational History   Not on file  Tobacco Use   Smoking status: Never   Smokeless tobacco: Never  Vaping Use   Vaping Use: Never used  Substance and Sexual Activity   Alcohol use: Not Currently   Drug use: Never   Sexual activity: Not on file  Other Topics Concern   Not on file  Social History Narrative   Not on file   Social Determinants of Health   Financial Resource Strain: Low Risk  (10/19/2021)   Overall Financial Resource Strain (CARDIA)    Difficulty of Paying Living Expenses: Not hard at all  Food Insecurity: No Food Insecurity (10/19/2021)   Hunger Vital Sign    Worried About Running Out of Food in the Last Year: Never true    Ran Out of Food in  the Last Year: Never true  Transportation Needs: No Transportation Needs (10/19/2021)   PRAPARE - Hydrologist (Medical): No    Lack of Transportation (Non-Medical): No  Physical Activity: Insufficiently Active (10/19/2021)   Exercise Vital Sign    Days of Exercise per Week: 3 days    Minutes of Exercise per Session: 40 min  Stress: No Stress Concern Present (10/19/2021)   [place]    Feeling of Stress : Not at all  Social Connections: Moderately Integrated (10/19/2021)   Social Connection and Isolation Panel [NHANES]    Frequency of Communication with Friends and Family: More than three times a week    Frequency of Social Gatherings with Friends and Family: Three times a week    Attends Religious Services: More than 4 times per year    Active Member of Clubs or Organizations: No    Attends Archivist Meetings: Never    Marital Status: Married  Human resources officer Violence: Not At Risk (10/19/2021)   Humiliation, Afraid, Rape, and Kick questionnaire    Fear of Current or Ex-Partner: No    Emotionally Abused: No    Physically Abused: No    Sexually Abused: No    Review of Systems: See HPI, otherwise negative ROS  Physical Exam: BP 132/78   Pulse 77   Temp (!) 97.3 F (36.3 C) (Temporal)   Resp 18   Ht '5\' 4"'$  (1.626 m)   Wt 122 lb (55.3 kg)   SpO2 100%   BMI 20.94 kg/m  General:   Alert,  pleasant and cooperative in NAD Head:  Normocephalic and atraumatic. Neck:  Supple; no masses or thyromegaly. Lungs:  Clear throughout to auscultation.    Heart:  Regular rate and rhythm. Abdomen:  Soft, nontender and nondistended. Normal bowel sounds, without guarding, and without rebound.   Neurologic:  Alert and  oriented x4;  grossly normal neurologically.  Impression/Plan: Amanda Love is here for an colonoscopy to be performed for colon cancer screening  Risks, benefits, limitations,  and alternatives regarding  colonoscopy have been reviewed with the patient.  Questions have been answered.  All parties agreeable.   Sherri Sear, MD  11/16/2021, 9:38 AM

## 2021-11-16 NOTE — Op Note (Signed)
[facility] Gastroenterology Patient Name: Amanda Love Procedure Date: 11/16/2021 9:34 AM MRN: [id number] Account #: [id number] Date of Birth: 12/15/1950 Admit Type: Outpatient Age: 71 Room: [facility] ENDO ROOM 4 Gender: Female Note Status: Finalized Instrument Name: Jasper Riling [id number] Procedure:             Colonoscopy Indications:           Screening for colorectal malignant neoplasm, Last                         colonoscopy 10 years ago Providers:             Lin Landsman MD, MD Referring MD:          Mikey Kirschner PA Medicines:             General Anesthesia Complications:         No immediate complications. Estimated blood loss: None. Procedure:             Pre-Anesthesia Assessment:                        - Prior to the procedure, a History and Physical was                         performed, and patient medications and allergies were                         reviewed. The patient is competent. The risks and                         benefits of the procedure and the sedation options and                         risks were discussed with the patient. All questions                         were answered and informed consent was obtained.                         Patient identification and proposed procedure were                         verified by the physician, the nurse, the                         anesthesiologist, the anesthetist and the technician                         in the pre-procedure area in the procedure room in the                         endoscopy suite. Mental Status Examination: alert and                         oriented. Airway Examination: normal oropharyngeal                         airway and neck mobility. Respiratory Examination:  clear to auscultation. CV Examination: normal.                         Prophylactic Antibiotics: The patient does not require                         prophylactic antibiotics. Prior  Anticoagulants: The                         patient has taken no previous anticoagulant or                         antiplatelet agents. ASA Grade Assessment: I - A                         normal, healthy patient. After reviewing the risks and                         benefits, the patient was deemed in satisfactory                         condition to undergo the procedure. The anesthesia                         plan was to use general anesthesia. Immediately prior                         to administration of medications, the patient was                         re-assessed for adequacy to receive sedatives. The                         heart rate, respiratory rate, oxygen saturations,                         blood pressure, adequacy of pulmonary ventilation, and                         response to care were monitored throughout the                         procedure. The physical status of the patient was                         re-assessed after the procedure.                        After obtaining informed consent, the colonoscope was                         passed under direct vision. Throughout the procedure,                         the patient's blood pressure, pulse, and oxygen                         saturations were monitored continuously. The  Colonoscope was introduced through the anus and                         advanced to the the terminal ileum, with                         identification of the appendiceal orifice and IC                         valve. The colonoscopy was performed without                         difficulty. The patient tolerated the procedure well.                         The quality of the bowel preparation was evaluated                         using the BBPS [facility] Bowel Preparation Scale) with                         scores of: Right Colon = 3, Transverse Colon = 3 and                         Left Colon = 3 (entire mucosa seen well with  no                         residual staining, small fragments of stool or opaque                         liquid). The total BBPS score equals 9. Findings:      The perianal and digital rectal examinations were normal. Pertinent       negatives include normal sphincter tone and no palpable rectal lesions.      The terminal ileum appeared normal.      A 6 mm polyp was found in the transverse colon. The polyp was sessile.       The polyp was removed with a cold snare. Resection and retrieval were       complete.      The retroflexed view of the distal rectum and anal verge was normal and       showed no anal or rectal abnormalities. Impression:            - The examined portion of the ileum was normal.                        - One 6 mm polyp in the transverse colon, removed with                         a cold snare. Resected and retrieved.                        - The distal rectum and anal verge are normal on                         retroflexion view. Recommendation:        - Discharge patient to home (with  escort).                        - Resume regular diet today.                        - Continue present medications.                        - Await pathology results.                        - Repeat colonoscopy in 5 to 7 years for surveillance                         based on pathology results. Procedure Code(s):     --- Professional ---                        [phone], Colonoscopy, flexible; with removal of                         tumor(s), polyp(s), or other lesion(s) by snare                         technique Diagnosis Code(s):     --- Professional ---                        Z12.11, Encounter for screening for malignant neoplasm                         of colon                        K63.5, Polyp of colon CPT copyright 2019 American Medical Association. All rights reserved. The codes documented in this report are preliminary and upon coder review may  be revised to meet current  compliance requirements. Dr. Ulyess Mort Lin Landsman MD, MD 11/16/2021 10:09:05 AM This report has been signed electronically. Number of Addenda: 0 Note Initiated On: 11/16/2021 9:34 AM Scope Withdrawal Time: 0 hours 14 minutes 37 seconds  Total Procedure Duration: 0 hours 19 minutes 54 seconds  Estimated Blood Loss:  Estimated blood loss: none.      [facility]

## 2021-11-16 NOTE — Transfer of Care (Signed)
Immediate Anesthesia Transfer of Care Note  Patient: Amanda Love  Procedure(s) Performed: COLONOSCOPY WITH PROPOFOL  Patient Location: PACU  Anesthesia Type:General  Level of Consciousness: drowsy  Airway & Oxygen Therapy: Patient Spontanous Breathing  Post-op Assessment: Report given to RN and Post -op Vital signs reviewed and stable  Post vital signs: Reviewed and stable  Last Vitals:  Vitals Value Taken Time  BP    Temp    Pulse 58 11/16/21 1010  Resp    SpO2 99 % 11/16/21 1010  Vitals shown include unvalidated device data.  Last Pain:  Vitals:   11/16/21 0919  TempSrc: Temporal  PainSc: 0-No pain         Complications: No notable events documented.

## 2021-11-17 ENCOUNTER — Encounter: Payer: Self-pay | Admitting: Gastroenterology

## 2021-11-17 LAB — SURGICAL PATHOLOGY

## 2021-11-17 NOTE — Anesthesia Preprocedure Evaluation (Signed)
Anesthesia Evaluation  Patient identified by MRN, date of birth, ID band Patient awake    Reviewed: Allergy & Precautions, H&P , NPO status , Patient's Chart, lab work & pertinent test results, reviewed documented beta blocker date and time   Airway Mallampati: II   Neck ROM: full    Dental  (+) Poor Dentition   Pulmonary neg pulmonary ROS,    Pulmonary exam normal        Cardiovascular negative cardio ROS Normal cardiovascular exam Rhythm:regular Rate:Normal     Neuro/Psych negative neurological ROS  negative psych ROS   GI/Hepatic negative GI ROS, Neg liver ROS,   Endo/Other  negative endocrine ROS  Renal/GU negative Renal ROS  negative genitourinary   Musculoskeletal   Abdominal   Peds  Hematology negative hematology ROS (+)   Anesthesia Other Findings Past Medical History: No date: Hyperlipidemia Past Surgical History: No date: COLONOSCOPY WITH PROPOFOL No date: EVALUATION UNDER ANESTHESIA WITH HEMORRHOIDECTOMY AND  PROCTOSCOPY BMI    Body Mass Index: 20.94 kg/m     Reproductive/Obstetrics negative OB ROS                             Anesthesia Physical Anesthesia Plan  ASA: 2  Anesthesia Plan: General   Post-op Pain Management:    Induction:   PONV Risk Score and Plan:   Airway Management Planned:   Additional Equipment:   Intra-op Plan:   Post-operative Plan:   Informed Consent: I have reviewed the patients History and Physical, chart, labs and discussed the procedure including the risks, benefits and alternatives for the proposed anesthesia with the patient or authorized representative who has indicated his/her understanding and acceptance.     Dental Advisory Given  Plan Discussed with: CRNA  Anesthesia Plan Comments:         Anesthesia Quick Evaluation

## 2021-11-17 NOTE — Anesthesia Postprocedure Evaluation (Signed)
Anesthesia Post Note  Patient: Amanda Love  Procedure(s) Performed: COLONOSCOPY WITH PROPOFOL  Patient location during evaluation: PACU Anesthesia Type: General Level of consciousness: awake and alert Pain management: pain level controlled Vital Signs Assessment: post-procedure vital signs reviewed and stable Respiratory status: spontaneous breathing, nonlabored ventilation, respiratory function stable and patient connected to nasal cannula oxygen Cardiovascular status: blood pressure returned to baseline and stable Postop Assessment: no apparent nausea or vomiting Anesthetic complications: no   No notable events documented.   Last Vitals:  Vitals:   11/16/21 1009 11/16/21 1029  BP: 100/64 128/68  Pulse: 62   Resp: (!) 22   Temp: (!) 36.1 C   SpO2: 99%     Last Pain:  Vitals:   11/16/21 1039  TempSrc:   PainSc: 0-No pain                 Molli Barrows

## 2021-11-18 ENCOUNTER — Encounter: Payer: Self-pay | Admitting: Gastroenterology

## 2021-11-30 DIAGNOSIS — M62830 Muscle spasm of back: Secondary | ICD-10-CM | POA: Diagnosis not present

## 2021-11-30 DIAGNOSIS — M9901 Segmental and somatic dysfunction of cervical region: Secondary | ICD-10-CM | POA: Diagnosis not present

## 2021-11-30 DIAGNOSIS — M5033 Other cervical disc degeneration, cervicothoracic region: Secondary | ICD-10-CM | POA: Diagnosis not present

## 2021-11-30 DIAGNOSIS — M9902 Segmental and somatic dysfunction of thoracic region: Secondary | ICD-10-CM | POA: Diagnosis not present

## 2022-01-04 DIAGNOSIS — M9902 Segmental and somatic dysfunction of thoracic region: Secondary | ICD-10-CM | POA: Diagnosis not present

## 2022-01-04 DIAGNOSIS — M62830 Muscle spasm of back: Secondary | ICD-10-CM | POA: Diagnosis not present

## 2022-01-04 DIAGNOSIS — M5033 Other cervical disc degeneration, cervicothoracic region: Secondary | ICD-10-CM | POA: Diagnosis not present

## 2022-01-04 DIAGNOSIS — M9901 Segmental and somatic dysfunction of cervical region: Secondary | ICD-10-CM | POA: Diagnosis not present

## 2022-02-08 DIAGNOSIS — M9901 Segmental and somatic dysfunction of cervical region: Secondary | ICD-10-CM | POA: Diagnosis not present

## 2022-02-08 DIAGNOSIS — M62830 Muscle spasm of back: Secondary | ICD-10-CM | POA: Diagnosis not present

## 2022-02-08 DIAGNOSIS — M5033 Other cervical disc degeneration, cervicothoracic region: Secondary | ICD-10-CM | POA: Diagnosis not present

## 2022-02-08 DIAGNOSIS — M9902 Segmental and somatic dysfunction of thoracic region: Secondary | ICD-10-CM | POA: Diagnosis not present

## 2022-02-19 NOTE — Progress Notes (Unsigned)
I,Sha'taria Tyson,acting as a Education administrator for [employer], PA-C.,have documented all relevant documentation on the behalf of Mikey Kirschner, PA-C,as directed by  Mikey Kirschner, PA-C while in the presence of Mikey Kirschner, PA-C.   Established patient visit   Patient: Amanda Love   DOB: January 17, 1951   71 y.o. Female  MRN: [id number] Visit Date: 02/22/2022  Today's healthcare provider: Mikey Kirschner, PA-C   Cc. Hld f/u  Subjective    HPI   Lipid/Cholesterol, Follow-up  Last lipid panel Other pertinent labs  Lab Results  Component Value Date   CHOL 216 (H) 05/26/2021   HDL 63 05/26/2021   LDLCALC 142 (H) 05/26/2021   TRIG 64 05/26/2021   CHOLHDL 3.4 05/26/2021   Lab Results  Component Value Date   ALT 31 05/26/2021   AST 27 05/26/2021   PLT 279 04/08/2021   TSH 3.230 12/26/2019     She was last seen for this 10 months ago.  Management since that visit includes refilled Crestor with unique sig-- pt will take 10 mg three times a week and 5 mg the other days. She will continue her current mainly vegetarian diet and exercise plans.  She reports excellent compliance with treatment. Patient has been taking 10 mg daily She is not having side effects.  Symptoms: No chest pain No chest pressure/discomfort  No dyspnea No lower extremity edema  No numbness or tingling of extremity No orthopnea  No palpitations No paroxysmal nocturnal dyspnea  No speech difficulty No syncope   Current diet: in general, a "healthy" diet   Current exercise:  treadmill  The 10-year ASCVD risk score (Arnett DK, et al., 2019) is: 27%  ---------------------------------------------------------------------------------------------------  Pt reports increased nasal congestion and PND for the last few weeks. She also reports her ears have a popping sensation. Describes her nose running when she eats hot foot. Denies sinus pressure,sore throat, ear pain.   Medications: Outpatient Medications Prior to  Visit  Medication Sig   ASCORBIC ACID PO Take 500 mg by mouth 2 (two) times daily.    aspirin 325 MG tablet Take 325 mg by mouth daily.   ASPIRIN 81 PO Take by mouth daily.   Calcium Carbonate-Vit D-Min (CALTRATE 600+D PLUS PO) Take by mouth daily.    CALCIUM CARBONATE-VITAMIN D PO Take by mouth.   cholecalciferol (VITAMIN D) 25 MCG (1000 UT) tablet Take 1,000 Units by mouth daily.   Cholecalciferol (VITAMIN D3 MAXIMUM STRENGTH) 125 MCG (5000 UT) capsule Take 5,000 Units by mouth daily.   Coenzyme Q10 10 MG capsule Take 300 mg by mouth daily.    CYANOCOBALAMIN PO Take by mouth daily.   hydrocortisone (ANUSOL-HC) 2.5 % rectal cream Place 1 application rectally 2 (two) times daily.   Menaquinone-7 (VITAMIN K2) 100 MCG CAPS Take by mouth daily.   Misc Natural Products (OSTEO BI-FLEX ADV TRIPLE ST PO) Take by mouth 2 (two) times daily.    Multiple Vitamin (MULTIVITAMIN) tablet Take 1 tablet by mouth daily.   naproxen (NAPROSYN) 250 MG tablet PLEASE SEE ATTACHED FOR DETAILED DIRECTIONS   Probiotic Product ([employer]S ADV PROBIOTIC GUMMIES PO) Take by mouth daily.   rosuvastatin (CRESTOR) 5 MG tablet Take by mouth.   SUPER B COMPLEX/C PO Take by mouth daily.   Turmeric 500 MG TABS Take by mouth daily.   zinc gluconate 50 MG tablet Take 50 mg by mouth daily.   rosuvastatin (CRESTOR) 10 MG tablet TAKE 1 TABLET (10 MG TOTAL) BY MOUTH DAILY. TAKE 10  MG BY MOUTH ON THREE TIMES A WEEK. TAKE 5 MG ALL OTHER DAYS OF THE WEEK.   No facility-administered medications prior to visit.    Review of Systems  Constitutional:  Negative for fatigue and fever.  HENT:  Positive for postnasal drip and rhinorrhea.   Respiratory:  Negative for cough and shortness of breath.   Cardiovascular:  Negative for chest pain and leg swelling.  Gastrointestinal:  Negative for abdominal pain.  Neurological:  Negative for dizziness and headaches.       Objective    Blood pressure 138/84, pulse 76, height '5\' 4"'$  (1.626 m),  weight 123 lb 6.4 oz (56 kg), SpO2 100 %.   Physical Exam Constitutional:      General: She is awake.     Appearance: She is well-developed.  HENT:     Head: Normocephalic.     Right Ear: Tympanic membrane normal.     Left Ear: Tympanic membrane normal.  Eyes:     Conjunctiva/sclera: Conjunctivae normal.  Cardiovascular:     Rate and Rhythm: Normal rate and regular rhythm.     Heart sounds: Normal heart sounds.  Pulmonary:     Effort: Pulmonary effort is normal.     Breath sounds: Normal breath sounds.  Skin:    General: Skin is warm.  Neurological:     Mental Status: She is alert and oriented to person, place, and time.  Psychiatric:        Attention and Perception: Attention normal.        Mood and Affect: Mood normal.        Speech: Speech normal.        Behavior: Behavior is cooperative.      No results found for any visits on 02/22/22.  Assessment & Plan     Problem List Items Addressed This Visit       Other   HLD (hyperlipidemia) - Primary    Pt now taking rosuvastatin 10 mg daily. Will repeat fasting lipids The 10-year ASCVD risk score (Arnett DK, et al., 2019) is: 27%       Relevant Orders   Lipid Profile   Comprehensive Metabolic Panel (CMET)   CBC w/Diff/Platelet   Nasal congestion    Advised she try either otc antihistamine or intranasal steroid-- rx triamcinolone nasal spray        Relevant Medications   triamcinolone (NASACORT) 55 MCG/ACT AERO nasal inhaler    Return in about 6 months (around 08/23/2022) for CPE.      I, Mikey Kirschner, PA-C have reviewed all documentation for this visit. The documentation on  02/22/2022 for the exam, diagnosis, procedures, and orders are all accurate and complete.  Mikey Kirschner, PA-C [facility] [address] [place], [place], [address] Office: [phone] Fax: [place]

## 2022-02-22 ENCOUNTER — Encounter: Payer: Self-pay | Admitting: Physician Assistant

## 2022-02-22 ENCOUNTER — Ambulatory Visit (INDEPENDENT_AMBULATORY_CARE_PROVIDER_SITE_OTHER): Payer: Medicare Other | Admitting: Physician Assistant

## 2022-02-22 VITALS — BP 138/84 | HR 76 | Ht 64.0 in | Wt 123.4 lb

## 2022-02-22 DIAGNOSIS — R0981 Nasal congestion: Secondary | ICD-10-CM | POA: Diagnosis not present

## 2022-02-22 DIAGNOSIS — E7841 Elevated Lipoprotein(a): Secondary | ICD-10-CM | POA: Diagnosis not present

## 2022-02-22 MED ORDER — TRIAMCINOLONE ACETONIDE 55 MCG/ACT NA AERO: 1.0000 | INHALATION_SPRAY | Freq: Every day | NASAL | 3 refills | Status: DC

## 2022-02-22 NOTE — Assessment & Plan Note (Signed)
Advised she try either otc antihistamine or intranasal steroid-- rx triamcinolone nasal spray

## 2022-02-22 NOTE — Assessment & Plan Note (Signed)
Pt now taking rosuvastatin 10 mg daily. Will repeat fasting lipids The 10-year ASCVD risk score (Arnett DK, et al., 2019) is: 27%

## 2022-02-23 ENCOUNTER — Other Ambulatory Visit: Payer: Self-pay | Admitting: Physician Assistant

## 2022-02-23 DIAGNOSIS — E7841 Elevated Lipoprotein(a): Secondary | ICD-10-CM

## 2022-02-23 LAB — COMPREHENSIVE METABOLIC PANEL
ALT: 33 IU/L — ABNORMAL HIGH (ref 0–32)
AST: 31 IU/L (ref 0–40)
Albumin/Globulin Ratio: 1.7 (ref 1.2–2.2)
Albumin: 4.6 g/dL (ref 3.8–4.8)
Alkaline Phosphatase: 63 IU/L (ref 44–121)
BUN/Creatinine Ratio: 15 (ref 12–28)
BUN: 10 mg/dL (ref 8–27)
Bilirubin Total: 0.4 mg/dL (ref 0.0–1.2)
CO2: 21 mmol/L (ref 20–29)
Calcium: 9.6 mg/dL (ref 8.7–10.3)
Chloride: 104 mmol/L (ref 96–106)
Creatinine, Ser: 0.66 mg/dL (ref 0.57–1.00)
Globulin, Total: 2.7 g/dL (ref 1.5–4.5)
Glucose: 101 mg/dL — ABNORMAL HIGH (ref 70–99)
Potassium: 4.6 mmol/L (ref 3.5–5.2)
Sodium: 140 mmol/L (ref 134–144)
Total Protein: 7.3 g/dL (ref 6.0–8.5)
eGFR: 93 mL/min/{1.73_m2} (ref >59)

## 2022-02-23 LAB — CBC WITH DIFFERENTIAL/PLATELET
Basophils Absolute: 0 10*3/uL (ref 0.0–0.2)
Basos: 1 %
EOS (ABSOLUTE): 0.1 10*3/uL (ref 0.0–0.4)
Eos: 1 %
Hematocrit: 38.7 % (ref 34.0–46.6)
Hemoglobin: 13.1 g/dL (ref 11.1–15.9)
Immature Grans (Abs): 0 10*3/uL (ref 0.0–0.1)
Immature Granulocytes: 0 %
Lymphocytes Absolute: 1.8 10*3/uL (ref 0.7–3.1)
Lymphs: 48 %
MCH: 32.2 pg (ref 26.6–33.0)
MCHC: 33.9 g/dL (ref 31.5–35.7)
MCV: 95 fL (ref 79–97)
Monocytes Absolute: 0.3 10*3/uL (ref 0.1–0.9)
Monocytes: 9 %
Neutrophils Absolute: 1.5 10*3/uL (ref 1.4–7.0)
Neutrophils: 41 %
Platelets: 250 10*3/uL (ref 150–450)
RBC: 4.07 x10E6/uL (ref 3.77–5.28)
RDW: 11.8 % (ref 11.7–15.4)
WBC: 3.6 10*3/uL (ref 3.4–10.8)

## 2022-02-23 LAB — LIPID PANEL
Chol/HDL Ratio: 3 ratio (ref 0.0–4.4)
Cholesterol, Total: 201 mg/dL — ABNORMAL HIGH (ref 100–199)
HDL: 67 mg/dL (ref >39)
LDL Chol Calc (NIH): 121 mg/dL — ABNORMAL HIGH (ref 0–99)
Triglycerides: 70 mg/dL (ref 0–149)
VLDL Cholesterol Cal: 13 mg/dL (ref 5–40)

## 2022-02-23 MED ORDER — ROSUVASTATIN CALCIUM 10 MG PO TABS: 10.0000 mg | ORAL_TABLET | Freq: Every day | ORAL | 1 refills | Status: DC

## 2022-03-15 DIAGNOSIS — M9901 Segmental and somatic dysfunction of cervical region: Secondary | ICD-10-CM | POA: Diagnosis not present

## 2022-03-15 DIAGNOSIS — M9902 Segmental and somatic dysfunction of thoracic region: Secondary | ICD-10-CM | POA: Diagnosis not present

## 2022-03-15 DIAGNOSIS — M62830 Muscle spasm of back: Secondary | ICD-10-CM | POA: Diagnosis not present

## 2022-03-15 DIAGNOSIS — M5033 Other cervical disc degeneration, cervicothoracic region: Secondary | ICD-10-CM | POA: Diagnosis not present

## 2022-03-18 ENCOUNTER — Other Ambulatory Visit: Payer: Self-pay | Admitting: Physician Assistant

## 2022-03-18 DIAGNOSIS — Z1231 Encounter for screening mammogram for malignant neoplasm of breast: Secondary | ICD-10-CM

## 2022-03-29 DIAGNOSIS — Z23 Encounter for immunization: Secondary | ICD-10-CM | POA: Diagnosis not present

## 2022-04-12 DIAGNOSIS — M62830 Muscle spasm of back: Secondary | ICD-10-CM | POA: Diagnosis not present

## 2022-04-12 DIAGNOSIS — M9901 Segmental and somatic dysfunction of cervical region: Secondary | ICD-10-CM | POA: Diagnosis not present

## 2022-04-12 DIAGNOSIS — M5033 Other cervical disc degeneration, cervicothoracic region: Secondary | ICD-10-CM | POA: Diagnosis not present

## 2022-04-12 DIAGNOSIS — M9902 Segmental and somatic dysfunction of thoracic region: Secondary | ICD-10-CM | POA: Diagnosis not present

## 2022-05-03 ENCOUNTER — Ambulatory Visit
Admission: RE | Admit: 2022-05-03 | Discharge: 2022-05-03 | Disposition: A | Discharge status: Home / Self Care | Payer: Medicare Other | Source: Ambulatory Visit | Attending: Physician Assistant | Admitting: Physician Assistant

## 2022-05-03 DIAGNOSIS — Z1231 Encounter for screening mammogram for malignant neoplasm of breast: Secondary | ICD-10-CM | POA: Diagnosis not present

## 2022-05-10 DIAGNOSIS — M5033 Other cervical disc degeneration, cervicothoracic region: Secondary | ICD-10-CM | POA: Diagnosis not present

## 2022-05-10 DIAGNOSIS — M62830 Muscle spasm of back: Secondary | ICD-10-CM | POA: Diagnosis not present

## 2022-05-10 DIAGNOSIS — M9902 Segmental and somatic dysfunction of thoracic region: Secondary | ICD-10-CM | POA: Diagnosis not present

## 2022-05-10 DIAGNOSIS — M9901 Segmental and somatic dysfunction of cervical region: Secondary | ICD-10-CM | POA: Diagnosis not present

## 2022-06-01 DIAGNOSIS — R1314 Dysphagia, pharyngoesophageal phase: Secondary | ICD-10-CM | POA: Diagnosis not present

## 2022-06-01 DIAGNOSIS — F458 Other somatoform disorders: Secondary | ICD-10-CM | POA: Diagnosis not present

## 2022-06-01 DIAGNOSIS — R059 Cough, unspecified: Secondary | ICD-10-CM | POA: Diagnosis not present

## 2022-06-01 DIAGNOSIS — K219 Gastro-esophageal reflux disease without esophagitis: Secondary | ICD-10-CM | POA: Diagnosis not present

## 2022-06-07 DIAGNOSIS — M62830 Muscle spasm of back: Secondary | ICD-10-CM | POA: Diagnosis not present

## 2022-06-07 DIAGNOSIS — M9901 Segmental and somatic dysfunction of cervical region: Secondary | ICD-10-CM | POA: Diagnosis not present

## 2022-06-07 DIAGNOSIS — M9902 Segmental and somatic dysfunction of thoracic region: Secondary | ICD-10-CM | POA: Diagnosis not present

## 2022-06-07 DIAGNOSIS — M5033 Other cervical disc degeneration, cervicothoracic region: Secondary | ICD-10-CM | POA: Diagnosis not present

## 2022-07-12 DIAGNOSIS — M5033 Other cervical disc degeneration, cervicothoracic region: Secondary | ICD-10-CM | POA: Diagnosis not present

## 2022-07-12 DIAGNOSIS — M9902 Segmental and somatic dysfunction of thoracic region: Secondary | ICD-10-CM | POA: Diagnosis not present

## 2022-07-12 DIAGNOSIS — M9901 Segmental and somatic dysfunction of cervical region: Secondary | ICD-10-CM | POA: Diagnosis not present

## 2022-07-12 DIAGNOSIS — M62830 Muscle spasm of back: Secondary | ICD-10-CM | POA: Diagnosis not present

## 2022-08-03 ENCOUNTER — Telehealth: Payer: Self-pay | Admitting: Physician Assistant

## 2022-08-03 NOTE — Telephone Encounter (Signed)
Contacted Amanda Love to schedule their annual wellness visit. Appointment made for 05/28/204.  [place] Direct Dial: [phone]

## 2022-08-09 DIAGNOSIS — M62830 Muscle spasm of back: Secondary | ICD-10-CM | POA: Diagnosis not present

## 2022-08-09 DIAGNOSIS — M5033 Other cervical disc degeneration, cervicothoracic region: Secondary | ICD-10-CM | POA: Diagnosis not present

## 2022-08-09 DIAGNOSIS — M9902 Segmental and somatic dysfunction of thoracic region: Secondary | ICD-10-CM | POA: Diagnosis not present

## 2022-08-09 DIAGNOSIS — M9901 Segmental and somatic dysfunction of cervical region: Secondary | ICD-10-CM | POA: Diagnosis not present

## 2022-08-17 DIAGNOSIS — R1314 Dysphagia, pharyngoesophageal phase: Secondary | ICD-10-CM | POA: Diagnosis not present

## 2022-08-17 DIAGNOSIS — R059 Cough, unspecified: Secondary | ICD-10-CM | POA: Diagnosis not present

## 2022-08-17 DIAGNOSIS — K219 Gastro-esophageal reflux disease without esophagitis: Secondary | ICD-10-CM | POA: Diagnosis not present

## 2022-09-07 DIAGNOSIS — M62830 Muscle spasm of back: Secondary | ICD-10-CM | POA: Diagnosis not present

## 2022-09-07 DIAGNOSIS — M5033 Other cervical disc degeneration, cervicothoracic region: Secondary | ICD-10-CM | POA: Diagnosis not present

## 2022-09-07 DIAGNOSIS — M9902 Segmental and somatic dysfunction of thoracic region: Secondary | ICD-10-CM | POA: Diagnosis not present

## 2022-09-07 DIAGNOSIS — M9901 Segmental and somatic dysfunction of cervical region: Secondary | ICD-10-CM | POA: Diagnosis not present

## 2022-10-05 DIAGNOSIS — M9901 Segmental and somatic dysfunction of cervical region: Secondary | ICD-10-CM | POA: Diagnosis not present

## 2022-10-05 DIAGNOSIS — M62830 Muscle spasm of back: Secondary | ICD-10-CM | POA: Diagnosis not present

## 2022-10-05 DIAGNOSIS — M5033 Other cervical disc degeneration, cervicothoracic region: Secondary | ICD-10-CM | POA: Diagnosis not present

## 2022-10-05 DIAGNOSIS — M9902 Segmental and somatic dysfunction of thoracic region: Secondary | ICD-10-CM | POA: Diagnosis not present

## 2022-10-09 ENCOUNTER — Other Ambulatory Visit: Payer: Self-pay | Admitting: Physician Assistant

## 2022-10-09 DIAGNOSIS — E7841 Elevated Lipoprotein(a): Secondary | ICD-10-CM

## 2022-10-26 ENCOUNTER — Ambulatory Visit (INDEPENDENT_AMBULATORY_CARE_PROVIDER_SITE_OTHER): Payer: Medicare Other

## 2022-10-26 VITALS — Ht 64.0 in | Wt 123.6 lb

## 2022-10-26 DIAGNOSIS — Z0000 Encounter for general adult medical examination without abnormal findings: Secondary | ICD-10-CM

## 2022-10-26 NOTE — Progress Notes (Signed)
I connected with  Amanda Love on 10/26/22 by a audio enabled telemedicine application and verified that I am speaking with the correct person using two identifiers.  Patient Location: Home  Provider Location: Office/Clinic  I discussed the limitations of evaluation and management by telemedicine. The patient expressed understanding and agreed to proceed.  Subjective:   Amanda Love is a 72 y.o. female who presents for Medicare Annual (Subsequent) preventive examination.  Review of Systems         Objective:    Today's Vitals   10/26/22 1148  Weight: 123 lb 9.6 oz (56.1 kg)  Height: 5\' 4"  (1.626 m)   Body mass index is 21.22 kg/m.     10/26/2022   11:56 AM 11/16/2021    9:17 AM 10/19/2021   10:11 AM 05/06/2020    9:53 AM 05/02/2019   10:59 AM  Advanced Directives  Does Patient Have a Medical Advance Directive? No No No No No  Would patient like information on creating a medical advance directive?   No - Patient declined No - Patient declined No - Patient declined    Current Medications (verified) Outpatient Encounter Medications as of 10/26/2022  Medication Sig   ASCORBIC ACID PO Take 500 mg by mouth 2 (two) times daily.    aspirin 325 MG tablet Take 325 mg by mouth daily.   Calcium Carbonate-Vit D-Min (CALTRATE 600+D PLUS PO) Take by mouth daily.    CALCIUM CARBONATE-VITAMIN D PO Take by mouth.   cholecalciferol (VITAMIN D) 25 MCG (1000 UT) tablet Take 1,000 Units by mouth daily.   Cholecalciferol (VITAMIN D3 MAXIMUM STRENGTH) 125 MCG (5000 UT) capsule Take 5,000 Units by mouth daily.   Coenzyme Q10 10 MG capsule Take 300 mg by mouth daily.    Misc Natural Products (OSTEO BI-FLEX ADV TRIPLE ST PO) Take by mouth 2 (two) times daily.    Multiple Vitamin (MULTIVITAMIN) tablet Take 1 tablet by mouth daily.   Probiotic Product (CVS ADV PROBIOTIC GUMMIES PO) Take by mouth daily.   rosuvastatin (CRESTOR) 10 MG tablet TAKE 1 TABLET (10 MG TOTAL) BY MOUTH DAILY. TAKE 10 MG BY MOUTH ON  THREE TIMES A WEEK. TAKE 5 MG ALL OTHER DAYS OF THE WEEK.   SUPER B COMPLEX/C PO Take by mouth daily.   ASPIRIN 81 PO Take by mouth daily. (Patient not taking: Reported on 10/26/2022)   CYANOCOBALAMIN PO Take by mouth daily. (Patient not taking: Reported on 10/26/2022)   hydrocortisone (ANUSOL-HC) 2.5 % rectal cream Place 1 application rectally 2 (two) times daily. (Patient not taking: Reported on 10/26/2022)   Menaquinone-7 (VITAMIN K2) 100 MCG CAPS Take by mouth daily. (Patient not taking: Reported on 10/26/2022)   naproxen (NAPROSYN) 250 MG tablet PLEASE SEE ATTACHED FOR DETAILED DIRECTIONS (Patient not taking: Reported on 10/26/2022)   triamcinolone (NASACORT) 55 MCG/ACT AERO nasal inhaler Place 1 spray into the nose daily. (Patient not taking: Reported on 10/26/2022)   Turmeric 500 MG TABS Take by mouth daily. (Patient not taking: Reported on 10/26/2022)   zinc gluconate 50 MG tablet Take 50 mg by mouth daily. (Patient not taking: Reported on 10/26/2022)   No facility-administered encounter medications on file as of 10/26/2022.    Allergies (verified) Patient has no known allergies.   History: Past Medical History:  Diagnosis Date   Hyperlipidemia    Past Surgical History:  Procedure Laterality Date   COLONOSCOPY WITH PROPOFOL     COLONOSCOPY WITH PROPOFOL N/A 11/16/2021   Procedure: COLONOSCOPY WITH PROPOFOL;  Surgeon: Toney Reil, MD;  Location: [facility] ENDOSCOPY;  Service: Gastroenterology;  Laterality: N/A;   EVALUATION UNDER ANESTHESIA WITH HEMORRHOIDECTOMY AND PROCTOSCOPY     Family History  Problem Relation Age of Onset   Breast cancer Neg Hx    Social History   Socioeconomic History   Marital status: Married    Spouse name: Not on file   Number of children: 1   Years of education: Not on file   Highest education level: Some college, no degree  Occupational History   Not on file  Tobacco Use   Smoking status: Never   Smokeless tobacco: Never  Vaping Use   Vaping  Use: Never used  Substance and Sexual Activity   Alcohol use: Never   Drug use: Never   Sexual activity: Not Currently    Birth control/protection: None  Other Topics Concern   Not on file  Social History Narrative   Not on file   Social Determinants of Health   Financial Resource Strain: Low Risk  (10/25/2022)   Overall Financial Resource Strain (CARDIA)    Difficulty of Paying Living Expenses: Not hard at all  Food Insecurity: No Food Insecurity (10/25/2022)   Hunger Vital Sign    Worried About Running Out of Food in the Last Year: Never true    Ran Out of Food in the Last Year: Never true  Transportation Needs: No Transportation Needs (10/25/2022)   PRAPARE - Administrator, Civil Service (Medical): No    Lack of Transportation (Non-Medical): No  Physical Activity: Sufficiently Active (10/25/2022)   Exercise Vital Sign    Days of Exercise per Week: 6 days    Minutes of Exercise per Session: 50 min  Stress: No Stress Concern Present (10/25/2022)   [employer] of Occupational Health - Occupational Stress Questionnaire    Feeling of Stress : Not at all  Social Connections: Unknown (10/25/2022)   Social Connection and Isolation Panel [NHANES]    Frequency of Communication with Friends and Family: More than three times a week    Frequency of Social Gatherings with Friends and Family: More than three times a week    Attends Religious Services: Not on Marketing executive or Organizations: Yes    Attends Banker Meetings: Patient declined    Marital Status: Married    Tobacco Counseling   Clinical Intake:  Pre-visit preparation completed: Yes  Pain : No/denies pain   BMI - recorded: 21.22 Nutritional Status: BMI of 19-24  Normal Nutritional Risks: None Diabetes: No  How often do you need to have someone help you when you read instructions, pamphlets, or other written materials from your doctor or pharmacy?: 1 -  Never  Diabetic?no  Interpreter Needed?: No  Comments: lives with husband Information entered by :: B.Ivana Nicastro,LPN   Activities of Daily Living    10/25/2022   11:18 AM  In your present state of health, do you have any difficulty performing the following activities:  Hearing? 0  Vision? 0  Difficulty concentrating or making decisions? 0  Walking or climbing stairs? 0  Dressing or bathing? 0  Doing errands, shopping? 0  Preparing Food and eating ? N  Using the Toilet? N  In the past six months, have you accidently leaked urine? N  Do you have problems with loss of bowel control? N  Managing your Medications? N  Managing your Finances? N  Housekeeping or managing your Housekeeping? N  Patient Care Team: Alfredia Ferguson, PA-C as PCP - General (Physician Assistant) Irene Limbo., MD (Ophthalmology)  Indicate any recent Medical Services you may have received from other than [facility] providers in the past year (date may be approximate).     Assessment:   This is a routine wellness examination for [place].  Hearing/Vision screen Hearing Screening - Comments:: Adequate hearing Vision Screening - Comments:: Adequate vision Dr Alvester Morin  Dietary issues and exercise activities discussed:     Goals Addressed             This Visit's Progress    DIET - EAT MORE FRUITS AND VEGETABLES   On track    DIET - INCREASE WATER INTAKE   On track    Recommend to drink at least 6-8 8oz glasses of water per day.       Depression Screen    10/26/2022   11:55 AM 10/19/2021   10:09 AM 10/16/2020   10:02 AM 05/06/2020    9:51 AM 04/18/2020    8:15 AM 03/28/2020   10:00 AM 05/02/2019   11:00 AM  PHQ 2/9 Scores  PHQ - 2 Score 0 0 0 0 0 0 0  PHQ- 9 Score   0  0      Fall Risk    10/25/2022   11:18 AM 10/19/2021   10:11 AM 10/15/2021    8:12 PM 10/16/2020   10:02 AM 05/06/2020    9:53 AM  Fall Risk   Falls in the past year? 0 0 0 0 0  Number falls in past yr: 0 0 0 0 0  Injury with  Fall? 0 0 0 0 0  Risk for fall due to : No Fall Risks No Fall Risks     Follow up Education provided;Falls prevention discussed Falls evaluation completed       FALL RISK PREVENTION PERTAINING TO THE HOME:  Any stairs in or around the home? Yes  If so, are there any without handrails? Yes  Home free of loose throw rugs in walkways, pet beds, electrical cords, etc? Yes  Adequate lighting in your home to reduce risk of falls? Yes   ASSISTIVE DEVICES UTILIZED TO PREVENT FALLS:  Life alert? No  Use of a cane, walker or w/c? No  Grab bars in the bathroom? Yes  Shower chair or bench in shower? Yes  Elevated toilet seat or a handicapped toilet? Yes   Cognitive Function:        10/26/2022   11:58 AM 10/19/2021   10:13 AM  6CIT Screen  What Year? 0 points 0 points  What month? 0 points 0 points  What time? 0 points 0 points  Count back from 20 0 points 0 points  Months in reverse 0 points 0 points  Repeat phrase 0 points   Total Score 0 points     Immunizations Immunization History  Administered Date(s) Administered   COVID-19, mRNA, vaccine(Comirnaty)12 years and older 03/29/2022   Fluad Quad(high Dose 65+) 04/18/2020   Influenza, High Dose Seasonal PF 03/28/2018, 02/16/2019, 03/19/2021   Influenza-Unspecified 04/06/2017, 03/29/2022   PFIZER Comirnaty(Gray Top)Covid-19 Tri-Sucrose Vaccine 09/25/2020   PFIZER(Purple Top)SARS-COV-2 Vaccination 06/21/2019, 07/12/2019   Pfizer Covid-19 Vaccine Bivalent Booster 68yrs & up 03/19/2021   Pneumococcal Conjugate-13 03/28/2018   Pneumococcal Polysaccharide-23 06/05/2020   RSV,unspecified 06/22/2022   Zoster Recombinat (Shingrix) 06/22/2022    TDAP status: Up to date  Flu Vaccine status: Up to date  Pneumococcal vaccine status: Up to date  Covid-19 vaccine status: Completed vaccines  Qualifies for Shingles Vaccine? Yes   Zostavax completed Yes   Shingrix Completed?: Yes  Screening Tests Health Maintenance  Topic Date Due    DTaP/Tdap/Td (1 - Tdap) Never done   DEXA SCAN  Never done   COVID-19 Vaccine (6 - 2023-24 season) 05/24/2022   Zoster Vaccines- Shingrix (2 of 2) 08/17/2022   INFLUENZA VACCINE  12/30/2022   Medicare Annual Wellness (AWV)  10/26/2023   MAMMOGRAM  05/03/2024   Colonoscopy  11/17/2026   Pneumonia Vaccine 10+ Years old  Completed   Hepatitis C Screening  Completed   HPV VACCINES  Aged Out    Health Maintenance  Health Maintenance Due  Topic Date Due   DTaP/Tdap/Td (1 - Tdap) Never done   DEXA SCAN  Never done   COVID-19 Vaccine (6 - 2023-24 season) 05/24/2022   Zoster Vaccines- Shingrix (2 of 2) 08/17/2022    Colorectal cancer screening: Type of screening: Colonoscopy. Completed yes. Repeat every 5-10 years  Mammogram status: Completed yes. Repeat every year  Bone Density status: Ordered yes. Pt provided with contact info and advised to call to schedule appt.  Lung Cancer Screening: (Low Dose CT Chest recommended if Age 27-80 years, 30 pack-year currently smoking OR have quit w/in 15years.) does not qualify.   Lung Cancer Screening Referral: no  Additional Screening:  Hepatitis C Screening: does not qualify; Completed yes  Vision Screening: Recommended annual ophthalmology exams for early detection of glaucoma and other disorders of the eye. Is the patient up to date with their annual eye exam?  Yes  Who is the provider or what is the name of the office in which the patient attends annual eye exams? Dr Alvester Morin If pt is not established with a provider, would they like to be referred to a provider to establish care? No .   Dental Screening: Recommended annual dental exams for proper oral hygiene  Community Resource Referral / Chronic Care Management: CRR required this visit?  No   CCM required this visit?  No     Plan:     I have personally reviewed and noted the following in the patient's chart:   Medical and social history Use of alcohol, tobacco or illicit drugs   Current medications and supplements including opioid prescriptions. Patient is not currently taking opioid prescriptions. Functional ability and status Nutritional status Physical activity Advanced directives List of other physicians Hospitalizations, surgeries, and ER visits in previous 12 months Vitals Screenings to include cognitive, depression, and falls Referrals and appointments  In addition, I have reviewed and discussed with patient certain preventive protocols, quality metrics, and best practice recommendations. A written personalized care plan for preventive services as well as general preventive health recommendations were provided to patient.     Sue Lush, LPN   [id number]   Nurse Notes: pt states she is doing very well and has no concerns or questions at this time.

## 2022-10-26 NOTE — Patient Instructions (Signed)
Amanda Love , Thank you for taking time to come for your Medicare Wellness Visit. I appreciate your ongoing commitment to your health goals. Please review the following plan we discussed and let me know if I can assist you in the future.   These are the goals we discussed:  Goals      DIET - EAT MORE FRUITS AND VEGETABLES     DIET - INCREASE WATER INTAKE     Recommend to drink at least 6-8 8oz glasses of water per day.        This is a list of the screening recommended for you and due dates:  Health Maintenance  Topic Date Due   DTaP/Tdap/Td vaccine (1 - Tdap) Never done   DEXA scan (bone density measurement)  Never done   COVID-19 Vaccine (6 - 2023-24 season) 05/24/2022   Zoster (Shingles) Vaccine (2 of 2) 08/17/2022   Flu Shot  12/30/2022   Medicare Annual Wellness Visit  10/26/2023   Mammogram  05/03/2024   Colon Cancer Screening  11/17/2026   Pneumonia Vaccine  Completed   Hepatitis C Screening  Completed   HPV Vaccine  Aged Out    Advanced directives: no  Conditions/risks identified: none  Next appointment: Follow up in one year for your annual wellness visit 10/31/2023 @10 :45am telephone   Preventive Care 65 Years and Older, Female Preventive care refers to lifestyle choices and visits with your health care provider that can promote health and wellness. What does preventive care include? A yearly physical exam. This is also called an annual well check. Dental exams once or twice a year. Routine eye exams. Ask your health care provider how often you should have your eyes checked. Personal lifestyle choices, including: Daily care of your teeth and gums. Regular physical activity. Eating a healthy diet. Avoiding tobacco and drug use. Limiting alcohol use. Practicing safe sex. Taking low-dose aspirin every day. Taking vitamin and mineral supplements as recommended by your health care provider. What happens during an annual well check? The services and screenings  done by your health care provider during your annual well check will depend on your age, overall health, lifestyle risk factors, and family history of disease. Counseling  Your health care provider may ask you questions about your: Alcohol use. Tobacco use. Drug use. Emotional well-being. Home and relationship well-being. Sexual activity. Eating habits. History of falls. Memory and ability to understand (cognition). Work and work Astronomer. Reproductive health. Screening  You may have the following tests or measurements: Height, weight, and BMI. Blood pressure. Lipid and cholesterol levels. These may be checked every 5 years, or more frequently if you are over 45 years old. Skin check. Lung cancer screening. You may have this screening every year starting at age 33 if you have a 30-pack-year history of smoking and currently smoke or have quit within the past 15 years. Fecal occult blood test (FOBT) of the stool. You may have this test every year starting at age 102. Flexible sigmoidoscopy or colonoscopy. You may have a sigmoidoscopy every 5 years or a colonoscopy every 10 years starting at age 86. Hepatitis C blood test. Hepatitis B blood test. Sexually transmitted disease (STD) testing. Diabetes screening. This is done by checking your blood sugar (glucose) after you have not eaten for a while (fasting). You may have this done every 1-3 years. Bone density scan. This is done to screen for osteoporosis. You may have this done starting at age 43. Mammogram. This may be done every  1-2 years. Talk to your health care provider about how often you should have regular mammograms. Talk with your health care provider about your test results, treatment options, and if necessary, the need for more tests. Vaccines  Your health care provider may recommend certain vaccines, such as: Influenza vaccine. This is recommended every year. Tetanus, diphtheria, and acellular pertussis (Tdap, Td)  vaccine. You may need a Td booster every 10 years. Zoster vaccine. You may need this after age 71. Pneumococcal 13-valent conjugate (PCV13) vaccine. One dose is recommended after age 96. Pneumococcal polysaccharide (PPSV23) vaccine. One dose is recommended after age 77. Talk to your health care provider about which screenings and vaccines you need and how often you need them. This information is not intended to replace advice given to you by your health care provider. Make sure you discuss any questions you have with your health care provider. Document Released: 06/13/2015 Document Revised: 02/04/2016 Document Reviewed: 03/18/2015 Elsevier Interactive Patient Education  2017 [employer].  Fall Prevention in the Home Falls can cause injuries. They can happen to people of all ages. There are many things you can do to make your home safe and to help prevent falls. What can I do on the outside of my home? Regularly fix the edges of walkways and driveways and fix any cracks. Remove anything that might make you trip as you walk through a door, such as a raised step or threshold. Trim any bushes or trees on the path to your home. Use bright outdoor lighting. Clear any walking paths of anything that might make someone trip, such as rocks or tools. Regularly check to see if handrails are loose or broken. Make sure that both sides of any steps have handrails. Any raised decks and porches should have guardrails on the edges. Have any leaves, snow, or ice cleared regularly. Use sand or salt on walking paths during winter. Clean up any spills in your garage right away. This includes oil or grease spills. What can I do in the bathroom? Use night lights. Install grab bars by the toilet and in the tub and shower. Do not use towel bars as grab bars. Use non-skid mats or decals in the tub or shower. If you need to sit down in the shower, use a plastic, non-slip stool. Keep the floor dry. Clean up any  water that spills on the floor as soon as it happens. Remove soap buildup in the tub or shower regularly. Attach bath mats securely with double-sided non-slip rug tape. Do not have throw rugs and other things on the floor that can make you trip. What can I do in the bedroom? Use night lights. Make sure that you have a light by your bed that is easy to reach. Do not use any sheets or blankets that are too big for your bed. They should not hang down onto the floor. Have a firm chair that has side arms. You can use this for support while you get dressed. Do not have throw rugs and other things on the floor that can make you trip. What can I do in the kitchen? Clean up any spills right away. Avoid walking on wet floors. Keep items that you use a lot in easy-to-reach places. If you need to reach something above you, use a strong step stool that has a grab bar. Keep electrical cords out of the way. Do not use floor polish or wax that makes floors slippery. If you must use wax, use non-skid  floor wax. Do not have throw rugs and other things on the floor that can make you trip. What can I do with my stairs? Do not leave any items on the stairs. Make sure that there are handrails on both sides of the stairs and use them. Fix handrails that are broken or loose. Make sure that handrails are as long as the stairways. Check any carpeting to make sure that it is firmly attached to the stairs. Fix any carpet that is loose or worn. Avoid having throw rugs at the top or bottom of the stairs. If you do have throw rugs, attach them to the floor with carpet tape. Make sure that you have a light switch at the top of the stairs and the bottom of the stairs. If you do not have them, ask someone to add them for you. What else can I do to help prevent falls? Wear shoes that: Do not have high heels. Have rubber bottoms. Are comfortable and fit you well. Are closed at the toe. Do not wear sandals. If you use a  stepladder: Make sure that it is fully opened. Do not climb a closed stepladder. Make sure that both sides of the stepladder are locked into place. Ask someone to hold it for you, if possible. Clearly mark and make sure that you can see: Any grab bars or handrails. First and last steps. Where the edge of each step is. Use tools that help you move around (mobility aids) if they are needed. These include: Canes. Walkers. Scooters. Crutches. Turn on the lights when you go into a dark area. Replace any light bulbs as soon as they burn out. Set up your furniture so you have a clear path. Avoid moving your furniture around. If any of your floors are uneven, fix them. If there are any pets around you, be aware of where they are. Review your medicines with your doctor. Some medicines can make you feel dizzy. This can increase your chance of falling. Ask your doctor what other things that you can do to help prevent falls. This information is not intended to replace advice given to you by your health care provider. Make sure you discuss any questions you have with your health care provider. Document Released: 03/13/2009 Document Revised: 10/23/2015 Document Reviewed: 06/21/2014 Elsevier Interactive Patient Education  2017 [employer].

## 2022-11-03 ENCOUNTER — Ambulatory Visit (INDEPENDENT_AMBULATORY_CARE_PROVIDER_SITE_OTHER): Payer: Medicare Other | Admitting: Physician Assistant

## 2022-11-03 ENCOUNTER — Encounter: Payer: Self-pay | Admitting: Physician Assistant

## 2022-11-03 VITALS — BP 122/85 | HR 70 | Temp 98.5°F | Resp 14 | Ht 64.0 in | Wt 117.4 lb

## 2022-11-03 DIAGNOSIS — Z13820 Encounter for screening for osteoporosis: Secondary | ICD-10-CM

## 2022-11-03 DIAGNOSIS — L299 Pruritus, unspecified: Secondary | ICD-10-CM | POA: Diagnosis not present

## 2022-11-03 DIAGNOSIS — K2100 Gastro-esophageal reflux disease with esophagitis, without bleeding: Secondary | ICD-10-CM | POA: Diagnosis not present

## 2022-11-03 DIAGNOSIS — E7800 Pure hypercholesterolemia, unspecified: Secondary | ICD-10-CM

## 2022-11-03 DIAGNOSIS — R030 Elevated blood-pressure reading, without diagnosis of hypertension: Secondary | ICD-10-CM | POA: Diagnosis not present

## 2022-11-03 DIAGNOSIS — Z780 Asymptomatic menopausal state: Secondary | ICD-10-CM

## 2022-11-03 MED ORDER — HYDROCORTISONE 2.5 % EX CREA: TOPICAL_CREAM | Freq: Two times a day (BID) | CUTANEOUS | 0 refills | Status: DC

## 2022-11-03 NOTE — Assessment & Plan Note (Addendum)
Follows w/ ent Now managed with omeprazole 20 mg and low acid diet  Advised intermittent nausea and chest heaviness likely 2/2 to gerd/esophagitis and to discuss at f/u with ENT

## 2022-11-03 NOTE — Assessment & Plan Note (Signed)
Manages with rosuvastatin 10 mg daily Diet changes hopefully reflected in lipid panel

## 2022-11-03 NOTE — Assessment & Plan Note (Signed)
Well controlled w/o meds in office today

## 2022-11-03 NOTE — Progress Notes (Signed)
I,Vanessa  Vital,acting as a Neurosurgeon for [employer], PA-C.,have documented all relevant documentation on the behalf of Alfredia Ferguson, PA-C,as directed by  Alfredia Ferguson, PA-C while in the presence of Alfredia Ferguson, PA-C.   Complete physical exam   Patient: Amanda Love   DOB: Nov 25, 1950   72 y.o. Female  MRN: [id number] Visit Date: 11/03/2022  Today's healthcare provider: Alfredia Ferguson, PA-C   Chief Complaint  Patient presents with   Annual Exam   Subjective    Amanda Love is a 72 y.o. female who presents today for a complete physical exam.  She reports consuming a general diet. She reports she walks frequently. She generally feels fairly well. She reports sleeping well. She does have additional problems to discuss today.  HPI  Pt reports persistent throat clearing and acid reflux, follows w/ Dr Andee Poles, ENT. Tx was pantoprazole 40 mg, now nexium 20 mg and diet changes. She has f/u next month.  Pt reports some persistent nausea, improved with cold water/cococut water, and a shortness of breath/chest heaviness. This is only felt when she thinks about it -- reports no pain with activity.   Pt would like refill on hydrocortisone cream she has, uses intermittently for irritation, bug bites, etc. Past Medical History:  Diagnosis Date   Hyperlipidemia    Past Surgical History:  Procedure Laterality Date   COLONOSCOPY WITH PROPOFOL     COLONOSCOPY WITH PROPOFOL N/A 11/16/2021   Procedure: COLONOSCOPY WITH PROPOFOL;  Surgeon: Toney Reil, MD;  Location: [facility] ENDOSCOPY;  Service: Gastroenterology;  Laterality: N/A;   EVALUATION UNDER ANESTHESIA WITH HEMORRHOIDECTOMY AND PROCTOSCOPY     Social History   Socioeconomic History   Marital status: Married    Spouse name: Not on file   Number of children: 1   Years of education: Not on file   Highest education level: Some college, no degree  Occupational History   Not on file  Tobacco Use   Smoking status: Never    Smokeless tobacco: Never  Vaping Use   Vaping Use: Never used  Substance and Sexual Activity   Alcohol use: Never   Drug use: Never   Sexual activity: Not Currently    Birth control/protection: None  Other Topics Concern   Not on file  Social History Narrative   Not on file   Social Determinants of Health   Financial Resource Strain: Low Risk  (10/25/2022)   Overall Financial Resource Strain (CARDIA)    Difficulty of Paying Living Expenses: Not hard at all  Food Insecurity: No Food Insecurity (10/25/2022)   Hunger Vital Sign    Worried About Running Out of Food in the Last Year: Never true    Ran Out of Food in the Last Year: Never true  Transportation Needs: No Transportation Needs (10/25/2022)   PRAPARE - Administrator, Civil Service (Medical): No    Lack of Transportation (Non-Medical): No  Physical Activity: Sufficiently Active (10/25/2022)   Exercise Vital Sign    Days of Exercise per Week: 6 days    Minutes of Exercise per Session: 50 min  Stress: No Stress Concern Present (10/25/2022)   [employer] of Occupational Health - Occupational Stress Questionnaire    Feeling of Stress : Not at all  Social Connections: Unknown (10/25/2022)   Social Connection and Isolation Panel [NHANES]    Frequency of Communication with Friends and Family: More than three times a week    Frequency of Social Gatherings with Friends and  Family: More than three times a week    Attends Religious Services: Not on file    Active Member of Clubs or Organizations: Yes    Attends Club or Organization Meetings: Patient declined    Marital Status: Married  Intimate Partner Violence: Not At Risk (10/26/2022)   Humiliation, Afraid, Rape, and Kick questionnaire    Fear of Current or Ex-Partner: No    Emotionally Abused: No    Physically Abused: No    Sexually Abused: No   Family Status  Relation Name Status   Mother  Deceased   Father  Deceased   Sister  Alive   Brother  Alive    Brother  Alive   Brother  Alive   Neg Hx  (Not Specified)   Family History  Problem Relation Age of Onset   Breast cancer Neg Hx    No Known Allergies  Patient Care Team: Alfredia Ferguson, PA-C as PCP - General (Physician Assistant) Irene Limbo., MD (Ophthalmology)   Medications: Outpatient Medications Prior to Visit  Medication Sig   ASCORBIC ACID PO Take 500 mg by mouth 2 (two) times daily.    aspirin 325 MG tablet Take 325 mg by mouth daily.   ASPIRIN 81 PO Take by mouth daily.   Calcium Carbonate-Vit D-Min (CALTRATE 600+D PLUS PO) Take by mouth daily.    CALCIUM CARBONATE-VITAMIN D PO Take by mouth.   cholecalciferol (VITAMIN D) 25 MCG (1000 UT) tablet Take 1,000 Units by mouth daily.   Cholecalciferol (VITAMIN D3 MAXIMUM STRENGTH) 125 MCG (5000 UT) capsule Take 5,000 Units by mouth daily.   Coenzyme Q10 10 MG capsule Take 300 mg by mouth daily.    CYANOCOBALAMIN PO Take by mouth daily.   esomeprazole (NEXIUM) 20 MG capsule Take 20 mg by mouth daily.   Misc Natural Products (OSTEO BI-FLEX ADV TRIPLE ST PO) Take by mouth 2 (two) times daily.    Multiple Vitamin (MULTIVITAMIN) tablet Take 1 tablet by mouth daily.   Probiotic Product ([employer]S ADV PROBIOTIC GUMMIES PO) Take by mouth daily.   rosuvastatin (CRESTOR) 10 MG tablet TAKE 1 TABLET (10 MG TOTAL) BY MOUTH DAILY. TAKE 10 MG BY MOUTH ON THREE TIMES A WEEK. TAKE 5 MG ALL OTHER DAYS OF THE WEEK.   SUPER B COMPLEX/C PO Take by mouth daily.   [DISCONTINUED] pantoprazole (PROTONIX) 40 MG tablet Take 40 mg by mouth daily.   [DISCONTINUED] hydrocortisone (ANUSOL-HC) 2.5 % rectal cream Place 1 application rectally 2 (two) times daily. (Patient not taking: Reported on 10/26/2022)   [DISCONTINUED] Menaquinone-7 (VITAMIN K2) 100 MCG CAPS Take by mouth daily. (Patient not taking: Reported on 10/26/2022)   [DISCONTINUED] naproxen (NAPROSYN) 250 MG tablet PLEASE SEE ATTACHED FOR DETAILED DIRECTIONS (Patient not taking: Reported on 10/26/2022)    [DISCONTINUED] triamcinolone (NASACORT) 55 MCG/ACT AERO nasal inhaler Place 1 spray into the nose daily. (Patient not taking: Reported on 11/03/2022)   [DISCONTINUED] Turmeric 500 MG TABS Take by mouth daily. (Patient not taking: Reported on 10/26/2022)   [DISCONTINUED] zinc gluconate 50 MG tablet Take 50 mg by mouth daily. (Patient not taking: Reported on 10/26/2022)   No facility-administered medications prior to visit.    Review of Systems  All other systems reviewed and are negative.   Objective    BP 122/85 (BP Location: Left Arm, Patient Position: Sitting, Cuff Size: Normal)   Pulse 70   Temp 98.5 F (36.9 C) (Oral)   Resp 14   Ht 5\' 4"  (1.626 m)  Wt 117 lb 6.4 oz (53.3 kg)   SpO2 99%   BMI 20.15 kg/m    Physical Exam Constitutional:      General: She is awake.     Appearance: She is well-developed. She is not ill-appearing.  HENT:     Head: Normocephalic.     Right Ear: Tympanic membrane normal.     Left Ear: Tympanic membrane normal.     Nose: Nose normal. No congestion or rhinorrhea.     Mouth/Throat:     Pharynx: No oropharyngeal exudate or posterior oropharyngeal erythema.  Eyes:     Conjunctiva/sclera: Conjunctivae normal.     Pupils: Pupils are equal, round, and reactive to light.  Neck:     Thyroid: No thyroid mass or thyromegaly.  Cardiovascular:     Rate and Rhythm: Normal rate and regular rhythm.     Heart sounds: Normal heart sounds.  Pulmonary:     Effort: Pulmonary effort is normal.     Breath sounds: Normal breath sounds.  Abdominal:     Palpations: Abdomen is soft.     Tenderness: There is no abdominal tenderness.  Musculoskeletal:     Right lower leg: No swelling. No edema.     Left lower leg: No swelling. No edema.  Lymphadenopathy:     Cervical: No cervical adenopathy.  Skin:    General: Skin is warm.  Neurological:     Mental Status: She is alert and oriented to person, place, and time.  Psychiatric:        Attention and Perception:  Attention normal.        Mood and Affect: Mood normal.        Speech: Speech normal.        Behavior: Behavior normal. Behavior is cooperative.     Last depression screening scores    11/03/2022    9:58 AM 10/26/2022   11:55 AM 10/19/2021   10:09 AM  PHQ 2/9 Scores  PHQ - 2 Score 0 0 0  PHQ- 9 Score 0     Last fall risk screening    11/03/2022    9:58 AM  Fall Risk   Falls in the past year? 0  Number falls in past yr: 0  Injury with Fall? 0  Risk for fall due to : No Fall Risks  Follow up Falls evaluation completed   Last Audit-C alcohol use screening    11/03/2022    9:58 AM  Alcohol Use Disorder Test (AUDIT)  1. How often do you have a drink containing alcohol? 0  2. How many drinks containing alcohol do you have on a typical day when you are drinking? 0  3. How often do you have six or more drinks on one occasion? 0  AUDIT-C Score 0   A score of 3 or more in women, and 4 or more in men indicates increased risk for alcohol abuse, EXCEPT if all of the points are from question 1   No results found for any visits on 11/03/22.  Assessment & Plan    Routine Health Maintenance and Physical Exam  Exercise Activities and Dietary recommendations --balanced diet high in fiber and protein, low in sugars, carbs, fats. --physical activity/exercise 30 minutes 3-5 times a week     Immunization History  Administered Date(s) Administered   COVID-19, mRNA, vaccine(Comirnaty)12 years and older 03/29/2022   Fluad Quad(high Dose 65+) 04/18/2020   Influenza, High Dose Seasonal PF 03/28/2018, 02/16/2019, 03/19/2021   Influenza-Unspecified 04/06/2017, 03/29/2022  PFIZER Comirnaty(Gray Top)Covid-19 Tri-Sucrose Vaccine 09/25/2020   PFIZER(Purple Top)SARS-COV-2 Vaccination 06/21/2019, 07/12/2019   Pfizer Covid-19 Vaccine Bivalent Booster 58yrs & up 03/19/2021   Pneumococcal Conjugate-13 03/28/2018   Pneumococcal Polysaccharide-23 06/05/2020   RSV,unspecified 06/22/2022   Zoster  Recombinat (Shingrix) 06/22/2022    Health Maintenance  Topic Date Due   DTaP/Tdap/Td (1 - Tdap) Never done   DEXA SCAN  Never done   COVID-19 Vaccine (6 - 2023-24 season) 05/24/2022   Zoster Vaccines- Shingrix (2 of 2) 08/17/2022   INFLUENZA VACCINE  12/30/2022   MAMMOGRAM  05/04/2023   Medicare Annual Wellness (AWV)  10/26/2023   Colonoscopy  11/17/2026   Pneumonia Vaccine 50+ Years old  Completed   Hepatitis C Screening  Completed   HPV VACCINES  Aged Out    Discussed health benefits of physical activity, and encouraged her to engage in regular exercise appropriate for her age and condition.  Problem List Items Addressed This Visit       Digestive   Gastroesophageal reflux disease with esophagitis without hemorrhage    Follows w/ ent Now managed with omeprazole 20 mg and low acid diet  Advised intermittent nausea and chest heaviness likely 2/2 to gerd/esophagitis and to discuss at f/u with ENT        Other   HLD (hyperlipidemia)    Manages with rosuvastatin 10 mg daily Diet changes hopefully reflected in lipid panel      Relevant Orders   Comprehensive Metabolic Panel (CMET)   Lipid Profile   Elevated blood-pressure reading without diagnosis of hypertension - Primary    Well controlled w/o meds in office today      Other Visit Diagnoses     Itching       Relevant Medications   hydrocortisone 2.5 % cream   Encounter for osteoporosis screening in asymptomatic postmenopausal patient       Relevant Orders   DG Bone Density        Return in about 1 year (around 11/03/2023) for CPE.     I, Alfredia Ferguson, PA-C have reviewed all documentation for this visit. The documentation on  11/03/22   for the exam, diagnosis, procedures, and orders are all accurate and complete.  Alfredia Ferguson, PA-C [facility] [address] [place], [place], [address] Office: [phone] Fax: [phone]   [facility]

## 2022-11-04 LAB — COMPREHENSIVE METABOLIC PANEL
ALT: 37 IU/L — ABNORMAL HIGH (ref 0–32)
AST: 39 IU/L (ref 0–40)
Albumin/Globulin Ratio: 1.8 (ref 1.2–2.2)
Albumin: 4.6 g/dL (ref 3.8–4.8)
Alkaline Phosphatase: 71 IU/L (ref 44–121)
BUN/Creatinine Ratio: 19 (ref 12–28)
BUN: 11 mg/dL (ref 8–27)
Bilirubin Total: 0.6 mg/dL (ref 0.0–1.2)
CO2: 25 mmol/L (ref 20–29)
Calcium: 9.6 mg/dL (ref 8.7–10.3)
Chloride: 103 mmol/L (ref 96–106)
Creatinine, Ser: 0.59 mg/dL (ref 0.57–1.00)
Globulin, Total: 2.5 g/dL (ref 1.5–4.5)
Glucose: 103 mg/dL — ABNORMAL HIGH (ref 70–99)
Potassium: 4.7 mmol/L (ref 3.5–5.2)
Sodium: 141 mmol/L (ref 134–144)
Total Protein: 7.1 g/dL (ref 6.0–8.5)
eGFR: 95 mL/min/{1.73_m2} (ref >59)

## 2022-11-04 LAB — LIPID PANEL
Chol/HDL Ratio: 2.7 ratio (ref 0.0–4.4)
Cholesterol, Total: 177 mg/dL (ref 100–199)
HDL: 66 mg/dL (ref >39)
LDL Chol Calc (NIH): 101 mg/dL — ABNORMAL HIGH (ref 0–99)
Triglycerides: 48 mg/dL (ref 0–149)
VLDL Cholesterol Cal: 10 mg/dL (ref 5–40)

## 2022-11-08 DIAGNOSIS — M5033 Other cervical disc degeneration, cervicothoracic region: Secondary | ICD-10-CM | POA: Diagnosis not present

## 2022-11-08 DIAGNOSIS — M9902 Segmental and somatic dysfunction of thoracic region: Secondary | ICD-10-CM | POA: Diagnosis not present

## 2022-11-08 DIAGNOSIS — M62830 Muscle spasm of back: Secondary | ICD-10-CM | POA: Diagnosis not present

## 2022-11-08 DIAGNOSIS — M9901 Segmental and somatic dysfunction of cervical region: Secondary | ICD-10-CM | POA: Diagnosis not present

## 2022-12-06 DIAGNOSIS — M9902 Segmental and somatic dysfunction of thoracic region: Secondary | ICD-10-CM | POA: Diagnosis not present

## 2022-12-06 DIAGNOSIS — M62830 Muscle spasm of back: Secondary | ICD-10-CM | POA: Diagnosis not present

## 2022-12-06 DIAGNOSIS — M9901 Segmental and somatic dysfunction of cervical region: Secondary | ICD-10-CM | POA: Diagnosis not present

## 2022-12-06 DIAGNOSIS — M5033 Other cervical disc degeneration, cervicothoracic region: Secondary | ICD-10-CM | POA: Diagnosis not present

## 2022-12-27 DIAGNOSIS — R1314 Dysphagia, pharyngoesophageal phase: Secondary | ICD-10-CM | POA: Diagnosis not present

## 2022-12-27 DIAGNOSIS — K219 Gastro-esophageal reflux disease without esophagitis: Secondary | ICD-10-CM | POA: Diagnosis not present

## 2023-01-03 DIAGNOSIS — M9901 Segmental and somatic dysfunction of cervical region: Secondary | ICD-10-CM | POA: Diagnosis not present

## 2023-01-03 DIAGNOSIS — M62830 Muscle spasm of back: Secondary | ICD-10-CM | POA: Diagnosis not present

## 2023-01-03 DIAGNOSIS — M9902 Segmental and somatic dysfunction of thoracic region: Secondary | ICD-10-CM | POA: Diagnosis not present

## 2023-01-03 DIAGNOSIS — M5033 Other cervical disc degeneration, cervicothoracic region: Secondary | ICD-10-CM | POA: Diagnosis not present

## 2023-01-28 ENCOUNTER — Ambulatory Visit
Admission: RE | Admit: 2023-01-28 | Discharge: 2023-01-28 | Disposition: A | Discharge status: Home / Self Care | Payer: Medicare Other | Source: Ambulatory Visit | Attending: Physician Assistant | Admitting: Physician Assistant

## 2023-01-28 DIAGNOSIS — M9901 Segmental and somatic dysfunction of cervical region: Secondary | ICD-10-CM | POA: Diagnosis not present

## 2023-01-28 DIAGNOSIS — Z780 Asymptomatic menopausal state: Secondary | ICD-10-CM | POA: Insufficient documentation

## 2023-01-28 DIAGNOSIS — M5033 Other cervical disc degeneration, cervicothoracic region: Secondary | ICD-10-CM | POA: Diagnosis not present

## 2023-01-28 DIAGNOSIS — M62830 Muscle spasm of back: Secondary | ICD-10-CM | POA: Diagnosis not present

## 2023-01-28 DIAGNOSIS — M810 Age-related osteoporosis without current pathological fracture: Secondary | ICD-10-CM | POA: Diagnosis not present

## 2023-01-28 DIAGNOSIS — Z13820 Encounter for screening for osteoporosis: Secondary | ICD-10-CM | POA: Diagnosis not present

## 2023-01-28 DIAGNOSIS — M9902 Segmental and somatic dysfunction of thoracic region: Secondary | ICD-10-CM | POA: Diagnosis not present

## 2023-02-01 DIAGNOSIS — M62830 Muscle spasm of back: Secondary | ICD-10-CM | POA: Diagnosis not present

## 2023-02-01 DIAGNOSIS — M9902 Segmental and somatic dysfunction of thoracic region: Secondary | ICD-10-CM | POA: Diagnosis not present

## 2023-02-01 DIAGNOSIS — M9901 Segmental and somatic dysfunction of cervical region: Secondary | ICD-10-CM | POA: Diagnosis not present

## 2023-02-01 DIAGNOSIS — M5033 Other cervical disc degeneration, cervicothoracic region: Secondary | ICD-10-CM | POA: Diagnosis not present

## 2023-02-02 DIAGNOSIS — M9901 Segmental and somatic dysfunction of cervical region: Secondary | ICD-10-CM | POA: Diagnosis not present

## 2023-02-02 DIAGNOSIS — M5033 Other cervical disc degeneration, cervicothoracic region: Secondary | ICD-10-CM | POA: Diagnosis not present

## 2023-02-02 DIAGNOSIS — M62830 Muscle spasm of back: Secondary | ICD-10-CM | POA: Diagnosis not present

## 2023-02-02 DIAGNOSIS — M9902 Segmental and somatic dysfunction of thoracic region: Secondary | ICD-10-CM | POA: Diagnosis not present

## 2023-02-05 DIAGNOSIS — M9902 Segmental and somatic dysfunction of thoracic region: Secondary | ICD-10-CM | POA: Diagnosis not present

## 2023-02-05 DIAGNOSIS — M9901 Segmental and somatic dysfunction of cervical region: Secondary | ICD-10-CM | POA: Diagnosis not present

## 2023-02-05 DIAGNOSIS — M5033 Other cervical disc degeneration, cervicothoracic region: Secondary | ICD-10-CM | POA: Diagnosis not present

## 2023-02-05 DIAGNOSIS — M62830 Muscle spasm of back: Secondary | ICD-10-CM | POA: Diagnosis not present

## 2023-02-07 DIAGNOSIS — M5033 Other cervical disc degeneration, cervicothoracic region: Secondary | ICD-10-CM | POA: Diagnosis not present

## 2023-02-07 DIAGNOSIS — M62830 Muscle spasm of back: Secondary | ICD-10-CM | POA: Diagnosis not present

## 2023-02-07 DIAGNOSIS — M9901 Segmental and somatic dysfunction of cervical region: Secondary | ICD-10-CM | POA: Diagnosis not present

## 2023-02-07 DIAGNOSIS — M9902 Segmental and somatic dysfunction of thoracic region: Secondary | ICD-10-CM | POA: Diagnosis not present

## 2023-02-07 NOTE — Progress Notes (Unsigned)
      Established patient visit   Patient: Amanda Love   DOB: 1950-10-07   72 y.o. Female  MRN: [id number] Visit Date: 02/08/2023  Today's healthcare provider: Sherlyn Hay, DO   No chief complaint on file.  Subjective    HPI   ***  {History (Optional):23778}  Medications: Outpatient Medications Prior to Visit  Medication Sig   ASCORBIC ACID PO Take 500 mg by mouth 2 (two) times daily.    aspirin 325 MG tablet Take 325 mg by mouth daily.   ASPIRIN 81 PO Take by mouth daily.   Calcium Carbonate-Vit D-Min (CALTRATE 600+D PLUS PO) Take by mouth daily.    CALCIUM CARBONATE-VITAMIN D PO Take by mouth.   cholecalciferol (VITAMIN D) 25 MCG (1000 UT) tablet Take 1,000 Units by mouth daily.   Cholecalciferol (VITAMIN D3 MAXIMUM STRENGTH) 125 MCG (5000 UT) capsule Take 5,000 Units by mouth daily.   Coenzyme Q10 10 MG capsule Take 300 mg by mouth daily.    CYANOCOBALAMIN PO Take by mouth daily.   esomeprazole (NEXIUM) 20 MG capsule Take 20 mg by mouth daily.   hydrocortisone 2.5 % cream Apply topically 2 (two) times daily.   Misc Natural Products (OSTEO BI-FLEX ADV TRIPLE ST PO) Take by mouth 2 (two) times daily.    Multiple Vitamin (MULTIVITAMIN) tablet Take 1 tablet by mouth daily.   Probiotic Product ([employer]S ADV PROBIOTIC GUMMIES PO) Take by mouth daily.   rosuvastatin (CRESTOR) 10 MG tablet TAKE 1 TABLET (10 MG TOTAL) BY MOUTH DAILY. TAKE 10 MG BY MOUTH ON THREE TIMES A WEEK. TAKE 5 MG ALL OTHER DAYS OF THE WEEK.   SUPER B COMPLEX/C PO Take by mouth daily.   No facility-administered medications prior to visit.    Review of Systems  ***  {Insert previous labs (optional):23779} {See past labs  Heme  Chem  Endocrine  Serology  Results Review (optional):1}   Objective    There were no vitals taken for this visit. {Insert last BP/Wt (optional):23777}{See vitals history (optional):1}   Physical Exam  ***  No results found for any visits on 02/08/23.  Assessment & Plan     There are no diagnoses linked to this encounter.   ***  No follow-ups on file.      I discussed the assessment and treatment plan with the patient  The patient was provided an opportunity to ask questions and all were answered. The patient agreed with the plan and demonstrated an understanding of the instructions.   The patient was advised to call back or seek an in-person evaluation if the symptoms worsen or if the condition fails to improve as anticipated.    Sherlyn Hay, DO  [facility] [phone] (phone) [phone] (fax)  [facility]

## 2023-02-08 ENCOUNTER — Ambulatory Visit (INDEPENDENT_AMBULATORY_CARE_PROVIDER_SITE_OTHER): Payer: Medicare Other | Admitting: Family Medicine

## 2023-02-08 ENCOUNTER — Encounter: Payer: Self-pay | Admitting: Family Medicine

## 2023-02-08 VITALS — BP 130/77 | HR 69 | Ht 64.0 in | Wt 119.5 lb

## 2023-02-08 DIAGNOSIS — M810 Age-related osteoporosis without current pathological fracture: Secondary | ICD-10-CM | POA: Insufficient documentation

## 2023-02-08 DIAGNOSIS — E782 Mixed hyperlipidemia: Secondary | ICD-10-CM

## 2023-02-08 HISTORY — DX: Age-related osteoporosis without current pathological fracture: M81.0

## 2023-02-08 NOTE — Assessment & Plan Note (Addendum)
Discussed treatment options, including worsening physical of anabolic agents versus bisphosphonates versus SERMs, as well as the appropriateness based on her very low T-score of -3.9.  Patient is interested in pursuing Prolia.  Will refer to endocrinology for further education/counseling and treatment. Did encourage her to do light free weights and body weight squats as part of her exercise routine, as well as potentially yoga/tai chi to improve balance and reduce her risk of fall in the future.

## 2023-02-09 DIAGNOSIS — M9901 Segmental and somatic dysfunction of cervical region: Secondary | ICD-10-CM | POA: Diagnosis not present

## 2023-02-09 DIAGNOSIS — M62830 Muscle spasm of back: Secondary | ICD-10-CM | POA: Diagnosis not present

## 2023-02-09 DIAGNOSIS — M5033 Other cervical disc degeneration, cervicothoracic region: Secondary | ICD-10-CM | POA: Diagnosis not present

## 2023-02-09 DIAGNOSIS — M9902 Segmental and somatic dysfunction of thoracic region: Secondary | ICD-10-CM | POA: Diagnosis not present

## 2023-02-10 ENCOUNTER — Encounter: Payer: Self-pay | Admitting: Family Medicine

## 2023-02-11 ENCOUNTER — Telehealth: Payer: Self-pay

## 2023-02-11 NOTE — Telephone Encounter (Signed)
Copied from CRM [id number]. Topic: Referral - Request for Referral >> Feb 11, 2023  8:37 AM Franchot Heidelberg wrote: Pt wants her referral information sent for osteoporosis to fax/location below. Pt says she contacted their office and they have not received anything from her.  [facility] Carlena Sax  Fax: [phone]

## 2023-02-28 DIAGNOSIS — M9902 Segmental and somatic dysfunction of thoracic region: Secondary | ICD-10-CM | POA: Diagnosis not present

## 2023-02-28 DIAGNOSIS — M5033 Other cervical disc degeneration, cervicothoracic region: Secondary | ICD-10-CM | POA: Diagnosis not present

## 2023-02-28 DIAGNOSIS — M62830 Muscle spasm of back: Secondary | ICD-10-CM | POA: Diagnosis not present

## 2023-02-28 DIAGNOSIS — M9901 Segmental and somatic dysfunction of cervical region: Secondary | ICD-10-CM | POA: Diagnosis not present

## 2023-03-04 DIAGNOSIS — M810 Age-related osteoporosis without current pathological fracture: Secondary | ICD-10-CM | POA: Diagnosis not present

## 2023-03-21 DIAGNOSIS — M5033 Other cervical disc degeneration, cervicothoracic region: Secondary | ICD-10-CM | POA: Diagnosis not present

## 2023-03-21 DIAGNOSIS — M9902 Segmental and somatic dysfunction of thoracic region: Secondary | ICD-10-CM | POA: Diagnosis not present

## 2023-03-21 DIAGNOSIS — M9901 Segmental and somatic dysfunction of cervical region: Secondary | ICD-10-CM | POA: Diagnosis not present

## 2023-03-21 DIAGNOSIS — M62830 Muscle spasm of back: Secondary | ICD-10-CM | POA: Diagnosis not present

## 2023-03-24 DIAGNOSIS — M810 Age-related osteoporosis without current pathological fracture: Secondary | ICD-10-CM | POA: Diagnosis not present

## 2023-04-05 ENCOUNTER — Other Ambulatory Visit: Payer: Self-pay | Admitting: Family Medicine

## 2023-04-05 DIAGNOSIS — Z1231 Encounter for screening mammogram for malignant neoplasm of breast: Secondary | ICD-10-CM

## 2023-04-06 DIAGNOSIS — Z23 Encounter for immunization: Secondary | ICD-10-CM | POA: Diagnosis not present

## 2023-04-18 DIAGNOSIS — M9902 Segmental and somatic dysfunction of thoracic region: Secondary | ICD-10-CM | POA: Diagnosis not present

## 2023-04-18 DIAGNOSIS — M5033 Other cervical disc degeneration, cervicothoracic region: Secondary | ICD-10-CM | POA: Diagnosis not present

## 2023-04-18 DIAGNOSIS — M9901 Segmental and somatic dysfunction of cervical region: Secondary | ICD-10-CM | POA: Diagnosis not present

## 2023-04-18 DIAGNOSIS — M62830 Muscle spasm of back: Secondary | ICD-10-CM | POA: Diagnosis not present

## 2023-05-05 ENCOUNTER — Ambulatory Visit
Admission: RE | Admit: 2023-05-05 | Discharge: 2023-05-05 | Disposition: A | Discharge status: Home / Self Care | Payer: Medicare Other | Source: Ambulatory Visit | Attending: Family Medicine | Admitting: Family Medicine

## 2023-05-05 DIAGNOSIS — Z1231 Encounter for screening mammogram for malignant neoplasm of breast: Secondary | ICD-10-CM | POA: Diagnosis not present

## 2023-05-11 NOTE — Progress Notes (Signed)
Mammogram negative - follow up in one year for annual breast cancer screening.

## 2023-05-17 ENCOUNTER — Ambulatory Visit (INDEPENDENT_AMBULATORY_CARE_PROVIDER_SITE_OTHER): Payer: Medicare Other | Admitting: Family Medicine

## 2023-05-17 ENCOUNTER — Encounter: Payer: Self-pay | Admitting: Family Medicine

## 2023-05-17 VITALS — BP 109/67 | HR 80 | Ht 64.0 in | Wt 115.7 lb

## 2023-05-17 DIAGNOSIS — F410 Panic disorder [episodic paroxysmal anxiety] without agoraphobia: Secondary | ICD-10-CM | POA: Insufficient documentation

## 2023-05-17 DIAGNOSIS — F411 Generalized anxiety disorder: Secondary | ICD-10-CM | POA: Diagnosis not present

## 2023-05-17 DIAGNOSIS — R110 Nausea: Secondary | ICD-10-CM | POA: Insufficient documentation

## 2023-05-17 DIAGNOSIS — M5033 Other cervical disc degeneration, cervicothoracic region: Secondary | ICD-10-CM | POA: Diagnosis not present

## 2023-05-17 DIAGNOSIS — K2100 Gastro-esophageal reflux disease with esophagitis, without bleeding: Secondary | ICD-10-CM

## 2023-05-17 DIAGNOSIS — M9902 Segmental and somatic dysfunction of thoracic region: Secondary | ICD-10-CM | POA: Diagnosis not present

## 2023-05-17 DIAGNOSIS — M62830 Muscle spasm of back: Secondary | ICD-10-CM | POA: Diagnosis not present

## 2023-05-17 DIAGNOSIS — M9901 Segmental and somatic dysfunction of cervical region: Secondary | ICD-10-CM | POA: Diagnosis not present

## 2023-05-17 MED ORDER — BUSPIRONE HCL 7.5 MG PO TABS: 7.5000 mg | ORAL_TABLET | Freq: Two times a day (BID) | ORAL | 0 refills | Status: DC | PRN

## 2023-05-17 MED ORDER — FLUOXETINE HCL 10 MG PO TABS: 10.0000 mg | ORAL_TABLET | Freq: Every day | ORAL | 3 refills | Status: DC

## 2023-05-17 NOTE — Assessment & Plan Note (Addendum)
-  Pt concerned about nausea related to panic. Pt states alka selzer helps - may continue this, can try pepto-bismal as well.  -Hold on any prescription- treating underlying cause of panic/anxiety first.  -Discussed herbal remedies of ginger chews, teas, lozenges, may also try

## 2023-05-17 NOTE — Assessment & Plan Note (Addendum)
Increased stress due to move which has lead to daily anxiety and panic attacks. Does appear psychological in nature due to major life change versus cardiac related. Will treat for panic and anxiety. - Fluoxetine 10 mg po daily for anxiety maintenance - Buspirone 7.5 mg po as needed twice daily for panic - Discussed option of cognitive behavioral therapy - pt decline for now states walking, mindfulness, and deep breathing tactics are helpful, feels she doe not need to talk to anyone as this time   Follow up in 4 weeks for mood and anxiety check post medication start.

## 2023-05-17 NOTE — Assessment & Plan Note (Addendum)
-  Pt states due to anxiety her gastric reflux has increased.  -Given hx of esophagitis may increase daily dose of esomeprazole from 20mg  to 40mg  for 2 weeks, and then titrate down back to 20mg . - May use Antacids (Tums/Mylanta) or Pepcid as well to help with symptoms - Alleviate head post prandial for 2-3 hours - Limit caffeine, chocolate, citrus, onions, garlic, an d alcohol -Avoid large meals and eat slowly,  - Keep head elevated at night while sleeping

## 2023-05-17 NOTE — Progress Notes (Signed)
Acute Office Visit  Introduced to nurse practitioner role and practice setting.  All questions answered.  Discussed provider/patient relationship and expectations.   Subjective:     Patient ID: Amanda Love, female    DOB: 04-11-1951, 72 y.o.   MRN: [id number]  Chief Complaint  Patient presents with   Anxiety    Onset of symptoms began about a month ago and has gotten worse since beginning. Patient reports symptoms of dizziness, chest pressure and shortness of breath. Patient reports when she has her spells she will take a few deep breath or take a walk or something to assist. She reports having a nausea feeling this morning     Pt presents with high levels of anxiety, stress, and panic for over a month since moving from single family home to a retirement community, which was a downsize. She feels this symptoms everyday leading to decrease sleep, panic attacks that involve nausea, high breathing, heart racing, making her feel out of control wanting to pass out. States panic attack happens at least once a day, can last several minutes. The feeling she gets is quite uncomfortable, states deep breathing and walking eventual episode goes away, but because these are happening every day she is concerned and it hoping for medication maintenance to get her through this hump of moving and adjusting to new lifestyle. She does have support at home, says she has had panic attacks before, but no this frequent - attributes to move.  Anxiety has also worsen her GERD, wondering if she could increase daily dose of esomeprazole, and she also says she has nausea with panic attacks states she says alka seltzer helps, inquiry about other options.   Anxiety Presents for initial visit. Onset was 1 to 6 months ago. The problem has been gradually worsening. Symptoms include decreased concentration, dizziness, excessive worry, hyperventilation, insomnia, muscle tension, nausea, panic, restlessness and shortness of breath.  Symptoms occur most days. The severity of symptoms is moderate. Exacerbated by: stress of move, episodes will randomly happen. The quality of sleep is poor. Nighttime awakenings: one to two.   Risk factors include a major life event. Her past medical history is significant for anxiety/panic attacks. Past treatments include herbal remedies (mindfulness, walking). The treatment provided mild relief.   States she feels fine during visit, but during episodes she is having these symptoms.  Review of Systems  Respiratory:  Positive for shortness of breath.   Gastrointestinal:  Positive for nausea.  Neurological:  Positive for dizziness.  Psychiatric/Behavioral:  Positive for decreased concentration. The patient has insomnia.   All other systems reviewed and are negative.     Objective:    BP 109/67 (BP Location: Left Arm, Patient Position: Sitting, Cuff Size: Normal)   Pulse 80   Ht 5\' 4"  (1.626 m)   Wt 115 lb 11.2 oz (52.5 kg)   SpO2 99%   BMI 19.86 kg/m        05/17/2023    8:42 AM 02/08/2023    9:01 AM  GAD 7 : Generalized Anxiety Score  Nervous, Anxious, on Edge 1 0  Control/stop worrying 0 0  Worry too much - different things 0 0  Trouble relaxing 1 0  Restless 0 0  Easily annoyed or irritable 0 0  Afraid - awful might happen 0 0  Total GAD 7 Score 2 0  Anxiety Difficulty Not difficult at all Not difficult at all   Physical Exam Vitals reviewed.  Constitutional:  Appearance: Normal appearance. She is normal weight.  HENT:     Head: Normocephalic.  Eyes:     Extraocular Movements: Extraocular movements intact.     Conjunctiva/sclera: Conjunctivae normal.     Pupils: Pupils are equal, round, and reactive to light.  Cardiovascular:     Rate and Rhythm: Normal rate and regular rhythm.     Pulses: Normal pulses.     Heart sounds: Normal heart sounds. No murmur heard.    No friction rub. No gallop.  Pulmonary:     Effort: Pulmonary effort is normal. No respiratory  distress.     Breath sounds: Normal breath sounds. No wheezing or rhonchi.  Chest:     Chest wall: No tenderness.  Abdominal:     General: Abdomen is flat.     Palpations: Abdomen is soft.  Musculoskeletal:        General: No swelling or tenderness. Normal range of motion.  Skin:    General: Skin is warm and dry.     Capillary Refill: Capillary refill takes less than 2 seconds.  Neurological:     General: No focal deficit present.     Mental Status: She is alert and oriented to person, place, and time. Mental status is at baseline.  Psychiatric:        Attention and Perception: Attention normal.        Mood and Affect: Affect normal. Mood is anxious.        Speech: Speech normal.        Behavior: Behavior normal. Behavior is cooperative.        Thought Content: Thought content is not paranoid or delusional. Thought content does not include homicidal or suicidal ideation.        Cognition and Memory: Cognition and memory normal.        Judgment: Judgment normal.     No results found for any visits on 05/17/23.      Assessment & Plan:   Problem List Items Addressed This Visit       Digestive   Gastroesophageal reflux disease with esophagitis without hemorrhage   -Pt states due to anxiety her gastric reflux has increased.  -Given hx of esophagitis may increase daily dose of esomeprazole from 20mg  to 40mg  for 2 weeks, and then titrate down back to 20mg . - May use Antacids (Tums/Mylanta) or Pepcid as well to help with symptoms - Alleviate head post prandial for 2-3 hours - Limit caffeine, chocolate, citrus, onions, garlic, an d alcohol -Avoid large meals and eat slowly,  - Keep head elevated at night while sleeping        Other   Generalized anxiety disorder with panic attacks - Primary   Increased stress due to move which has lead to daily anxiety and panic attacks. Does appear psychological in nature due to major life change versus cardiac related. Will treat for panic  and anxiety. - Fluoxetine 10 mg po daily for anxiety maintenance - Buspirone 7.5 mg po as needed twice daily for panic - Discussed option of cognitive behavioral therapy - pt decline for now states walking, mindfulness, and deep breathing tactics are helpful, feels she doe not need to talk to anyone as this time   Follow up in 4 weeks for mood and anxiety check post medication start.      Relevant Medications   FLUoxetine (PROZAC) 10 MG tablet   busPIRone (BUSPAR) 7.5 MG tablet   Nausea   -Pt concerned about nausea related to panic.  Pt states alka selzer helps - may continue this, can try pepto-bismal as well.  -Hold on any prescription- treating underlying cause of panic/anxiety first.  -Discussed herbal remedies of ginger chews, teas, lozenges, may also try          Meds ordered this encounter  Medications   FLUoxetine (PROZAC) 10 MG tablet    Sig: Take 1 tablet (10 mg total) by mouth daily.    Dispense:  90 tablet    Refill:  3   busPIRone (BUSPAR) 7.5 MG tablet    Sig: Take 1 tablet (7.5 mg total) by mouth 2 (two) times daily as needed.    Dispense:  60 tablet    Refill:  0    Return in about 4 weeks (around 06/14/2023) for mood.  I, Sallee Provencal, FNP, have reviewed all documentation for this visit. The documentation on 05/17/23 for the exam, diagnosis, procedures, and orders are all accurate and complete.   Sallee Provencal, FNP

## 2023-05-27 ENCOUNTER — Encounter: Payer: Self-pay | Admitting: Family Medicine

## 2023-06-08 ENCOUNTER — Other Ambulatory Visit: Payer: Self-pay | Admitting: Family Medicine

## 2023-06-08 DIAGNOSIS — F410 Panic disorder [episodic paroxysmal anxiety] without agoraphobia: Secondary | ICD-10-CM

## 2023-06-14 DIAGNOSIS — M9901 Segmental and somatic dysfunction of cervical region: Secondary | ICD-10-CM | POA: Diagnosis not present

## 2023-06-14 DIAGNOSIS — M9902 Segmental and somatic dysfunction of thoracic region: Secondary | ICD-10-CM | POA: Diagnosis not present

## 2023-06-14 DIAGNOSIS — M62830 Muscle spasm of back: Secondary | ICD-10-CM | POA: Diagnosis not present

## 2023-06-14 DIAGNOSIS — M5033 Other cervical disc degeneration, cervicothoracic region: Secondary | ICD-10-CM | POA: Diagnosis not present

## 2023-06-15 ENCOUNTER — Encounter: Payer: Self-pay | Admitting: Family Medicine

## 2023-06-15 ENCOUNTER — Ambulatory Visit (INDEPENDENT_AMBULATORY_CARE_PROVIDER_SITE_OTHER): Payer: Medicare Other | Admitting: Family Medicine

## 2023-06-15 DIAGNOSIS — F411 Generalized anxiety disorder: Secondary | ICD-10-CM | POA: Diagnosis not present

## 2023-06-15 DIAGNOSIS — F410 Panic disorder [episodic paroxysmal anxiety] without agoraphobia: Secondary | ICD-10-CM

## 2023-06-15 MED ORDER — FLUOXETINE HCL 20 MG PO CAPS: 20.0000 mg | ORAL_CAPSULE | Freq: Every day | ORAL | 0 refills | Status: DC

## 2023-06-15 MED ORDER — BUSPIRONE HCL 7.5 MG PO TABS: 7.5000 mg | ORAL_TABLET | Freq: Two times a day (BID) | ORAL | 1 refills | Status: DC | PRN

## 2023-06-15 NOTE — Assessment & Plan Note (Addendum)
 Partial response to Fluoxetine  10mg  daily. Buspar  used intermittently with some benefit. Gets morning palpitations due to worry of daily tasks. Palpitations at this time do not appear to be cardiac related rather anxiety driven. -Increase Fluoxetine  to 20mg  daily. -Continue Buspar  7.5 mg as needed BID for panic/ anxiety episodes. -Consider increasing to 40mg  daily if not improvement after increase to 20 mg daily. -Schedule follow-up appointment in 6 weeks, either in-person or via telemedicine, to reassess anxiety symptoms. -Declines referral to behavioral health for CBT. States has get support at home.  -Suggest writing down "to-do" list to help decrease anxiety and visualize daily needs. -Continue deep breathing exercises

## 2023-06-15 NOTE — Progress Notes (Signed)
 Established Patient Office Visit  Subjective   Patient ID: Amanda Love, female    DOB: February 01, 1951  Age: 73 y.o. MRN: [id number]  Chief Complaint  Patient presents with   Follow-up    Pt stated--medication is helping but still get anxious especially in the morning.    Pt presents for a follow-up on anxiety, four weeks after initiating treatment with Prozac  10mg  daily and as-needed Buspar  7.5 mg. She reports a slight improvement in symptoms after four weeks of treatment, but not to a satisfactory level. She describes waking up with a sense of panic and a racing heart, particularly when faced with a busy schedule. However, she also notes that some mornings are calm, suggesting that the medication may be having some effect.  She has been using the as-needed Buspar  occasionally, but not daily. States hard to tell if it helps in the moment.  She denies any cardiac issues, attributing the racing heart to anxiety. She has been using an at-home EKG monitor, which consistently shows normal results. She also reports having a strong support network through her church and friends, and engages in activities such as music and reading to help manage anxiety.  She expresses a desire for a consistent healthcare provider who understands her body's system, indicating a preference for continuity of care. She is open to increasing the dosage of her medication to better manage her anxiety symptoms.       06/15/2023    2:58 PM 05/17/2023    8:42 AM 02/08/2023    9:00 AM  Depression screen PHQ 2/9  Decreased Interest 0 0 0  Down, Depressed, Hopeless 0 0 0  PHQ - 2 Score 0 0 0  Altered sleeping 0    Tired, decreased energy 0    Change in appetite 0    Feeling bad or failure about yourself  0    Trouble concentrating 0    Moving slowly or fidgety/restless 0    Suicidal thoughts 0    PHQ-9 Score 0    Difficult doing work/chores Somewhat difficult         06/15/2023    2:59 PM 05/17/2023    8:42 AM  02/08/2023    9:01 AM  GAD 7 : Generalized Anxiety Score  Nervous, Anxious, on Edge 1 1 0  Control/stop worrying 0 0 0  Worry too much - different things 0 0 0  Trouble relaxing 0 1 0  Restless 0 0 0  Easily annoyed or irritable 0 0 0  Afraid - awful might happen 0 0 0  Total GAD 7 Score 1 2 0  Anxiety Difficulty Not difficult at all Not difficult at all Not difficult at all     Review of Systems  All other systems reviewed and are negative.   Negative unless indicated in HPI   Objective:     BP 134/80 (BP Location: Left Arm, Patient Position: Sitting, Cuff Size: Normal)   Pulse 66   Ht 5\' 4"  (1.626 m)   Wt 109 lb (49.4 kg)   SpO2 99%   BMI 18.71 kg/m    Physical Exam Constitutional:      General: She is not in acute distress.    Appearance: Normal appearance. She is normal weight. She is not ill-appearing, toxic-appearing or diaphoretic.  HENT:     Head: Normocephalic.  Eyes:     Extraocular Movements: Extraocular movements intact.  Cardiovascular:     Rate and Rhythm: Normal rate and regular rhythm.  Pulses: Normal pulses.     Heart sounds: Normal heart sounds. No murmur heard.    No friction rub. No gallop.  Pulmonary:     Effort: Pulmonary effort is normal. No respiratory distress.     Breath sounds: Normal breath sounds. No stridor. No wheezing, rhonchi or rales.  Chest:     Chest wall: No tenderness.  Skin:    General: Skin is warm and dry.  Neurological:     General: No focal deficit present.     Mental Status: She is alert and oriented to person, place, and time. Mental status is at baseline.  Psychiatric:        Mood and Affect: Mood normal.        Behavior: Behavior normal.        Thought Content: Thought content normal.        Judgment: Judgment normal.    No results found for any visits on 06/15/23.    Assessment & Plan:  Generalized anxiety disorder with panic attacks Assessment & Plan: Partial response to Fluoxetine  10mg  daily.  Buspar  used intermittently with some benefit. Gets morning palpitations due to worry of daily tasks. Palpitations at this time do not appear to be cardiac related rather anxiety driven. -Increase Fluoxetine  to 20mg  daily. -Continue Buspar  7.5 mg as needed BID for panic/ anxiety episodes. -Consider increasing to 40mg  daily if not improvement after increase to 20 mg daily. -Schedule follow-up appointment in 6 weeks, either in-person or via telemedicine, to reassess anxiety symptoms. -Declines referral to behavioral health for CBT. States has get support at home.  -Suggest writing down "to-do" list to help decrease anxiety and visualize daily needs. -Continue deep breathing exercises  Orders: -     FLUoxetine  HCl; Take 1 capsule (20 mg total) by mouth daily.  Dispense: 60 capsule; Refill: 0 -     busPIRone  HCl; Take 1 tablet (7.5 mg total) by mouth 2 (two) times daily as needed.  Dispense: 60 tablet; Refill: 1     Return in about 6 weeks (around 07/27/2023) for mood check.   I, Tasia Farr, FNP, have reviewed all documentation for this visit. The documentation on 06/15/23 for the exam, diagnosis, procedures, and orders are all accurate and complete.   Tasia Farr, FNP

## 2023-06-27 DIAGNOSIS — J301 Allergic rhinitis due to pollen: Secondary | ICD-10-CM | POA: Diagnosis not present

## 2023-06-27 DIAGNOSIS — K219 Gastro-esophageal reflux disease without esophagitis: Secondary | ICD-10-CM | POA: Diagnosis not present

## 2023-07-11 DIAGNOSIS — M62830 Muscle spasm of back: Secondary | ICD-10-CM | POA: Diagnosis not present

## 2023-07-11 DIAGNOSIS — M9901 Segmental and somatic dysfunction of cervical region: Secondary | ICD-10-CM | POA: Diagnosis not present

## 2023-07-11 DIAGNOSIS — M9902 Segmental and somatic dysfunction of thoracic region: Secondary | ICD-10-CM | POA: Diagnosis not present

## 2023-07-11 DIAGNOSIS — M5033 Other cervical disc degeneration, cervicothoracic region: Secondary | ICD-10-CM | POA: Diagnosis not present

## 2023-07-27 ENCOUNTER — Encounter: Payer: Self-pay | Admitting: Family Medicine

## 2023-07-27 ENCOUNTER — Ambulatory Visit (INDEPENDENT_AMBULATORY_CARE_PROVIDER_SITE_OTHER): Payer: Medicare Other | Admitting: Family Medicine

## 2023-07-27 VITALS — BP 123/77 | HR 63 | Resp 16 | Ht 64.0 in | Wt 115.5 lb

## 2023-07-27 DIAGNOSIS — F411 Generalized anxiety disorder: Secondary | ICD-10-CM | POA: Diagnosis not present

## 2023-07-27 DIAGNOSIS — F410 Panic disorder [episodic paroxysmal anxiety] without agoraphobia: Secondary | ICD-10-CM | POA: Diagnosis not present

## 2023-07-27 DIAGNOSIS — E782 Mixed hyperlipidemia: Secondary | ICD-10-CM

## 2023-07-27 DIAGNOSIS — M810 Age-related osteoporosis without current pathological fracture: Secondary | ICD-10-CM | POA: Diagnosis not present

## 2023-07-27 DIAGNOSIS — Z09 Encounter for follow-up examination after completed treatment for conditions other than malignant neoplasm: Secondary | ICD-10-CM | POA: Diagnosis not present

## 2023-07-27 NOTE — Progress Notes (Signed)
 Established patient visit   Patient: Amanda Love   DOB: [birth year]   73 y.o. Female  MRN: [id number] Visit Date: 07/27/2023  Today's healthcare provider: Sherlyn Hay, DO   Chief Complaint  Patient presents with   Anxiety    Patient feels like she is doing much better and wonders if she needs to keep taking the medication Buspirone caused significant diarrhea   Subjective    HPI The patient presents for follow-up of anxiety symptoms.  In January, the patient experienced increased anxiety due to a move and other life changes. She was prescribed fluoxetine, starting at 10 mg, which was later increased to 20 mg. The 20 mg dose has been more effective, making her feel 'much more relaxed' and reducing nervousness. She is considering tapering off the medication to see if her anxiety is solely due to adapting to changes rather than the medication itself.  She has a history of high cholesterol and has been on rosuvastatin for a long time. Initially, she was on 5 mg, but the 10 mg dose has been more effective in managing her cholesterol levels. Her cholesterol levels have decreased significantly, and she plans to have her levels checked again in June.  She received a flu vaccine at [facility] in October or November. She also received a pneumonia vaccine three years ago and has had the shingles vaccine. She is unsure about the last time she received a tetanus vaccine.  She has a history of osteoporosis and receives Prolia injections every six months, with the next dose due in April. She maintains an active lifestyle and follows a healthy diet.     Medications: Outpatient Medications Prior to Visit  Medication Sig   aspirin 325 MG tablet Take 325 mg by mouth daily.   Calcium Carbonate-Vit D-Min (CALTRATE 600+D PLUS PO) Take by mouth daily.    CALCIUM CARBONATE-VITAMIN D PO Take by mouth.   Cholecalciferol (VITAMIN D3 MAXIMUM STRENGTH) 125 MCG (5000 UT) capsule Take 5,000 Units by mouth  daily.   Coenzyme Q10 10 MG capsule Take 300 mg by mouth daily.    CYANOCOBALAMIN PO Take by mouth daily.   FLUoxetine (PROZAC) 20 MG capsule Take 1 capsule (20 mg total) by mouth daily.   Misc Natural Products (OSTEO BI-FLEX ADV TRIPLE ST PO) Take by mouth 2 (two) times daily.    Multiple Vitamin (MULTIVITAMIN) tablet Take 1 tablet by mouth daily.   SUPER B COMPLEX/C PO Take by mouth daily.   rosuvastatin (CRESTOR) 10 MG tablet TAKE 1 TABLET (10 MG TOTAL) BY MOUTH DAILY. TAKE 10 MG BY MOUTH ON THREE TIMES A WEEK. TAKE 5 MG ALL OTHER DAYS OF THE WEEK.   [DISCONTINUED] ASCORBIC ACID PO Take 500 mg by mouth 2 (two) times daily.    [DISCONTINUED] ASPIRIN 81 PO Take by mouth daily.   [DISCONTINUED] busPIRone (BUSPAR) 7.5 MG tablet Take 1 tablet (7.5 mg total) by mouth 2 (two) times daily as needed.   [DISCONTINUED] cholecalciferol (VITAMIN D) 25 MCG (1000 UT) tablet Take 1,000 Units by mouth daily.   [DISCONTINUED] esomeprazole (NEXIUM) 20 MG capsule Take 20 mg by mouth daily.   [DISCONTINUED] hydrocortisone 2.5 % cream Apply topically 2 (two) times daily.   [DISCONTINUED] Probiotic Product ([facility] ADV PROBIOTIC GUMMIES PO) Take by mouth daily.   No facility-administered medications prior to visit.        Objective    BP 123/77   Pulse 63   Resp 16  Ht 5\' 4"  (1.626 m)   Wt 115 lb 8 oz (52.4 kg)   SpO2 99%   BMI 19.83 kg/m     Physical Exam Vitals and nursing note reviewed.  Constitutional:      General: She is not in acute distress.    Appearance: Normal appearance.  HENT:     Head: Normocephalic and atraumatic.  Eyes:     General: No scleral icterus.    Conjunctiva/sclera: Conjunctivae normal.  Cardiovascular:     Rate and Rhythm: Normal rate.  Pulmonary:     Effort: Pulmonary effort is normal.  Neurological:     Mental Status: She is alert and oriented to person, place, and time. Mental status is at baseline.  Psychiatric:        Mood and Affect: Mood normal.         Behavior: Behavior normal.      No results found for any visits on 07/27/23.  Assessment & Plan    Follow-up exam, less than 3 months since previous exam  Generalized anxiety disorder with panic attacks Assessment & Plan: Anxiety exacerbated by a move in January has improved with fluoxetine 20 mg, which is effective. She is considering tapering the dose to assess symptom control without medication. - Continue fluoxetine 20 mg as prescribed - Consider tapering fluoxetine to 10 mg if symptoms remain controlled - Contact provider via MyChart if a refill is needed or if tapering is desired   Hyperlipemia, mixed Assessment & Plan: Cholesterol levels have improved with rosuvastatin 10 mg, which is effective. - Continue rosuvastatin 10 mg daily - Schedule cholesterol check in June   Age-related osteoporosis without current pathological fracture Assessment & Plan: Receiving Prolia injections biannually with the next dose due in April. - Continue Prolia injections as scheduled with endocrinology   General Health Maintenance Received COVID, flu, and pneumonia vaccines. Uncertain about tetanus vaccine status; shingles vaccine is up to date. - Check vaccination records with [facility] for tetanus vaccine status; receive if not up-to-date - Encourage shingles vaccine at [facility]   Return in about 4 months (around 11/24/2023) for Anx/Dep, bloodwork.      I discussed the assessment and treatment plan with the patient  The patient was provided an opportunity to ask questions and all were answered. The patient agreed with the plan and demonstrated an understanding of the instructions.   The patient was advised to call back or seek an in-person evaluation if the symptoms worsen or if the condition fails to improve as anticipated.    Sherlyn Hay, DO  [facility] [phone] (phone) [phone] (fax)  [facility]

## 2023-07-27 NOTE — Assessment & Plan Note (Signed)
 Cholesterol levels have improved with rosuvastatin 10 mg, which is effective. - Continue rosuvastatin 10 mg daily - Schedule cholesterol check in June

## 2023-07-27 NOTE — Assessment & Plan Note (Signed)
 Receiving Prolia injections biannually with the next dose due in April. - Continue Prolia injections as scheduled with endocrinology

## 2023-07-27 NOTE — Assessment & Plan Note (Signed)
 Anxiety exacerbated by a move in January has improved with fluoxetine 20 mg, which is effective. She is considering tapering the dose to assess symptom control without medication. - Continue fluoxetine 20 mg as prescribed - Consider tapering fluoxetine to 10 mg if symptoms remain controlled - Contact provider via MyChart if a refill is needed or if tapering is desired

## 2023-08-07 ENCOUNTER — Other Ambulatory Visit: Payer: Self-pay | Admitting: Family Medicine

## 2023-08-07 DIAGNOSIS — F411 Generalized anxiety disorder: Secondary | ICD-10-CM

## 2023-08-08 DIAGNOSIS — M62830 Muscle spasm of back: Secondary | ICD-10-CM | POA: Diagnosis not present

## 2023-08-08 DIAGNOSIS — M9901 Segmental and somatic dysfunction of cervical region: Secondary | ICD-10-CM | POA: Diagnosis not present

## 2023-08-08 DIAGNOSIS — M5033 Other cervical disc degeneration, cervicothoracic region: Secondary | ICD-10-CM | POA: Diagnosis not present

## 2023-08-08 DIAGNOSIS — M9902 Segmental and somatic dysfunction of thoracic region: Secondary | ICD-10-CM | POA: Diagnosis not present

## 2023-09-05 DIAGNOSIS — M5033 Other cervical disc degeneration, cervicothoracic region: Secondary | ICD-10-CM | POA: Diagnosis not present

## 2023-09-05 DIAGNOSIS — M62830 Muscle spasm of back: Secondary | ICD-10-CM | POA: Diagnosis not present

## 2023-09-05 DIAGNOSIS — M9902 Segmental and somatic dysfunction of thoracic region: Secondary | ICD-10-CM | POA: Diagnosis not present

## 2023-09-05 DIAGNOSIS — M9901 Segmental and somatic dysfunction of cervical region: Secondary | ICD-10-CM | POA: Diagnosis not present

## 2023-09-26 DIAGNOSIS — M810 Age-related osteoporosis without current pathological fracture: Secondary | ICD-10-CM | POA: Diagnosis not present

## 2023-10-02 ENCOUNTER — Other Ambulatory Visit: Payer: Self-pay | Admitting: Physician Assistant

## 2023-10-02 DIAGNOSIS — E7841 Elevated Lipoprotein(a): Secondary | ICD-10-CM

## 2023-10-03 DIAGNOSIS — M9902 Segmental and somatic dysfunction of thoracic region: Secondary | ICD-10-CM | POA: Diagnosis not present

## 2023-10-03 DIAGNOSIS — M5033 Other cervical disc degeneration, cervicothoracic region: Secondary | ICD-10-CM | POA: Diagnosis not present

## 2023-10-03 DIAGNOSIS — M9901 Segmental and somatic dysfunction of cervical region: Secondary | ICD-10-CM | POA: Diagnosis not present

## 2023-10-03 DIAGNOSIS — M62830 Muscle spasm of back: Secondary | ICD-10-CM | POA: Diagnosis not present

## 2023-10-31 ENCOUNTER — Ambulatory Visit: Payer: Medicare Other

## 2023-10-31 VITALS — Ht 64.0 in | Wt 115.0 lb

## 2023-10-31 DIAGNOSIS — M62830 Muscle spasm of back: Secondary | ICD-10-CM | POA: Diagnosis not present

## 2023-10-31 DIAGNOSIS — Z0000 Encounter for general adult medical examination without abnormal findings: Secondary | ICD-10-CM

## 2023-10-31 DIAGNOSIS — M9901 Segmental and somatic dysfunction of cervical region: Secondary | ICD-10-CM | POA: Diagnosis not present

## 2023-10-31 DIAGNOSIS — M5033 Other cervical disc degeneration, cervicothoracic region: Secondary | ICD-10-CM | POA: Diagnosis not present

## 2023-10-31 DIAGNOSIS — M9902 Segmental and somatic dysfunction of thoracic region: Secondary | ICD-10-CM | POA: Diagnosis not present

## 2023-10-31 NOTE — Progress Notes (Signed)
 Subjective:   Amanda Love is a 73 y.o. who presents for a Medicare Wellness preventive visit.  As a reminder, Annual Wellness Visits don't include a physical exam, and some assessments may be limited, especially if this visit is performed virtually. We may recommend an in-person follow-up visit with your provider if needed.  Visit Complete: Virtual I connected with  Amanda Love on 10/31/23 by a audio enabled telemedicine application and verified that I am speaking with the correct person using two identifiers.  Patient Location: Home  Provider Location: Office/Clinic  I discussed the limitations of evaluation and management by telemedicine. The patient expressed understanding and agreed to proceed.  Vital Signs: Because this visit was a virtual/telehealth visit, some criteria may be missing or patient reported. Any vitals not documented were not able to be obtained and vitals that have been documented are patient reported.  VideoDeclined- This patient declined Librarian, academic. Therefore the visit was completed with audio only.  Persons Participating in Visit: Patient.  AWV Questionnaire: Yes: Patient Medicare AWV questionnaire was completed by the patient on 10/28/2023; I have confirmed that all information answered by patient is correct and no changes since this date.  Cardiac Risk Factors include: advanced age (>6men, >26 women);dyslipidemia     Objective:     Today's Vitals   10/31/23 1136  Weight: 115 lb (52.2 kg)  Height: 5\' 4"  (1.626 m)   Body mass index is 19.74 kg/m.     10/31/2023   11:44 AM 10/26/2022   11:56 AM 11/16/2021    9:17 AM 10/19/2021   10:11 AM 05/06/2020    9:53 AM 05/02/2019   10:59 AM  Advanced Directives  Does Patient Have a Medical Advance Directive? No No No No No No  Would patient like information on creating a medical advance directive? Yes (MAU/Ambulatory/Procedural Areas - Information given)   No - Patient declined No  - Patient declined No - Patient declined    Current Medications (verified) Outpatient Encounter Medications as of 10/31/2023  Medication Sig   aspirin 325 MG tablet Take 325 mg by mouth daily.   Calcium  Carbonate-Vit D-Min (CALTRATE 600+D PLUS PO) Take by mouth daily.    CALCIUM  CARBONATE-VITAMIN D PO Take by mouth.   Cholecalciferol (VITAMIN D3 MAXIMUM STRENGTH) 125 MCG (5000 UT) capsule Take 5,000 Units by mouth daily.   Coenzyme Q10 10 MG capsule Take 300 mg by mouth daily.    CYANOCOBALAMIN PO Take by mouth daily.   Misc Natural Products (OSTEO BI-FLEX ADV TRIPLE ST PO) Take by mouth 2 (two) times daily.    Multiple Vitamin (MULTIVITAMIN) tablet Take 1 tablet by mouth daily.   rosuvastatin  (CRESTOR ) 10 MG tablet TAKE 1 TABLET (10 MG TOTAL) BY MOUTH DAILY. TAKE 10 MG BY MOUTH ON THREE TIMES A WEEK. TAKE 5 MG ALL OTHER DAYS OF THE WEEK.   SUPER B COMPLEX/C PO Take by mouth daily.   [DISCONTINUED] FLUoxetine  (PROZAC ) 20 MG capsule TAKE 1 CAPSULE BY MOUTH EVERY DAY   No facility-administered encounter medications on file as of 10/31/2023.    Allergies (verified) Patient has no known allergies.   History: Past Medical History:  Diagnosis Date   Age related osteoporosis 02/08/2023   Hyperlipidemia    Past Surgical History:  Procedure Laterality Date   COLONOSCOPY WITH PROPOFOL      COLONOSCOPY WITH PROPOFOL  N/A 11/16/2021   Procedure: COLONOSCOPY WITH PROPOFOL ;  Surgeon: Selena Daily, MD;  Location: [facility] ENDOSCOPY;  Service: Gastroenterology;  Laterality: N/A;   EVALUATION UNDER ANESTHESIA WITH HEMORRHOIDECTOMY AND PROCTOSCOPY     Family History  Problem Relation Age of Onset   Breast cancer Neg Hx    Social History   Socioeconomic History   Marital status: Married    Spouse name: Not on file   Number of children: 1   Years of education: Not on file   Highest education level: 12th grade  Occupational History   Not on file  Tobacco Use   Smoking status: Never    Smokeless tobacco: Never  Vaping Use   Vaping status: Never Used  Substance and Sexual Activity   Alcohol use: Never   Drug use: Never   Sexual activity: Not Currently    Birth control/protection: None  Other Topics Concern   Not on file  Social History Narrative   Not on file   Social Drivers of Health   Financial Resource Strain: Low Risk  (10/31/2023)   Overall Financial Resource Strain (CARDIA)    Difficulty of Paying Living Expenses: Not hard at all  Food Insecurity: No Food Insecurity (10/31/2023)   Hunger Vital Sign    Worried About Running Out of Food in the Last Year: Never true    Ran Out of Food in the Last Year: Never true  Transportation Needs: No Transportation Needs (10/31/2023)   PRAPARE - Administrator, Civil Service (Medical): No    Lack of Transportation (Non-Medical): No  Physical Activity: Sufficiently Active (10/31/2023)   Exercise Vital Sign    Days of Exercise per Week: 7 days    Minutes of Exercise per Session: 30 min  Stress: No Stress Concern Present (10/31/2023)   [employer] of Occupational Health - Occupational Stress Questionnaire    Feeling of Stress : Not at all  Social Connections: Socially Integrated (10/31/2023)   Social Connection and Isolation Panel [NHANES]    Frequency of Communication with Friends and Family: More than three times a week    Frequency of Social Gatherings with Friends and Family: More than three times a week    Attends Religious Services: More than 4 times per year    Active Member of [employer] or Organizations: Yes    Attends Engineer, structural: More than 4 times per year    Marital Status: Married    Tobacco Counseling Counseling given: Yes    Clinical Intake:  Pre-visit preparation completed: Yes  Pain : No/denies pain     BMI - recorded: 19.74 Nutritional Status: BMI of 19-24  Normal Nutritional Risks: None Diabetes: No  Lab Results  Component Value Date   HGBA1C 5.4 03/29/2017      How often do you need to have someone help you when you read instructions, pamphlets, or other written materials from your doctor or pharmacy?: 1 - Never  Interpreter Needed?: No  Information entered by :: Sally Crazier CMA   Activities of Daily Living     10/28/2023    8:17 AM 11/03/2022    9:58 AM  In your present state of health, do you have any difficulty performing the following activities:  Hearing? 0 0  Vision? 0 0  Difficulty concentrating or making decisions? 0 0  Walking or climbing stairs? 0 0  Dressing or bathing? 0 0  Doing errands, shopping? 0 0  Preparing Food and eating ? N   Using the Toilet? N   In the past six months, have you accidently leaked urine? N   Do you  have problems with loss of bowel control? N   Managing your Medications? N   Managing your Finances? N   Housekeeping or managing your Housekeeping? N     Patient Care Team: Pardue, Asencion Blacksmith, DO as PCP - General (Family Medicine) Bary Boss., MD (Ophthalmology) Roddy Citizen (Endocrinology) Rogers Clayman, MD as Referring Physician (Otolaryngology) Angeline Kemps, DC as Referring Physician (Chiropractic Medicine)  I have updated your Care Teams any recent Medical Services you may have received from other providers in the past year.     Assessment:    This is a routine wellness examination for Aubrei.  Hearing/Vision screen Hearing Screening - Comments:: Patient denies any hearing difficulties.   Vision Screening - Comments:: Wears rx glasses - up to date with routine eye exams  Patient sees Shann Darnel in [place]    Goals Addressed             This Visit's Progress    Patient Stated       I want to get my new house all in order and do more bible studying       Depression Screen     10/31/2023   11:38 AM 07/27/2023   10:45 AM 06/15/2023    2:58 PM 05/17/2023    8:42 AM 02/08/2023    9:00 AM 11/03/2022    9:58 AM 10/26/2022   11:55 AM  PHQ 2/9  Scores  PHQ - 2 Score 0 0 0 0 0 0 0  PHQ- 9 Score 0 0 0   0     Fall Risk     10/28/2023    8:17 AM 07/27/2023   10:45 AM 05/17/2023    8:42 AM 02/08/2023    9:00 AM 11/03/2022    9:58 AM  Fall Risk   Falls in the past year? 0 0 0 0 0  Number falls in past yr: 0 0 0  0  Injury with Fall? 0 0 0 0 0  Risk for fall due to : No Fall Risks  No Fall Risks No Fall Risks No Fall Risks  Follow up Falls evaluation completed   Falls evaluation completed Falls evaluation completed    MEDICARE RISK AT HOME:  Medicare Risk at Home Any stairs in or around the home?: (Patient-Rptd) No If so, are there any without handrails?: (Patient-Rptd) No Home free of loose throw rugs in walkways, pet beds, electrical cords, etc?: (Patient-Rptd) No Adequate lighting in your home to reduce risk of falls?: (Patient-Rptd) No Life alert?: (Patient-Rptd) No Use of a cane, walker or w/c?: (Patient-Rptd) No Grab bars in the bathroom?: (Patient-Rptd) No Shower chair or bench in shower?: (Patient-Rptd) No Elevated toilet seat or a handicapped toilet?: (Patient-Rptd) No  TIMED UP AND GO:  Was the test performed?  No  Cognitive Function: 6CIT completed        10/31/2023   11:44 AM 10/26/2022   11:58 AM 10/19/2021   10:13 AM  6CIT Screen  What Year? 0 points 0 points 0 points  What month? 0 points 0 points 0 points  What time? 0 points 0 points 0 points  Count back from 20 0 points 0 points 0 points  Months in reverse 0 points 0 points 0 points  Repeat phrase 0 points 0 points   Total Score 0 points 0 points     Immunizations Immunization History  Administered Date(s) Administered   Fluad Quad(high Dose 65+) 04/18/2020   Influenza, High Dose Seasonal  PF 03/28/2018, 02/16/2019, 03/19/2021   Influenza-Unspecified 04/06/2017, 03/29/2022   PFIZER Comirnaty(Gray Top)Covid-19 Tri-Sucrose Vaccine 09/25/2020   PFIZER(Purple Top)SARS-COV-2 Vaccination 06/21/2019, 07/12/2019   Pfizer Covid-19 Vaccine Bivalent  Booster 62yrs & up 03/19/2021   Pfizer(Comirnaty)Fall Seasonal Vaccine 12 years and older 03/29/2022, 04/06/2023   Pneumococcal Conjugate-13 03/28/2018   Pneumococcal Polysaccharide-23 06/05/2020   RSV,unspecified 06/22/2022   Zoster Recombinant(Shingrix) 06/22/2022, 09/01/2022    Screening Tests Health Maintenance  Topic Date Due   DTaP/Tdap/Td (1 - Tdap) Never done   COVID-19 Vaccine (7 - Pfizer risk 2024-25 season) 10/04/2023   INFLUENZA VACCINE  12/30/2023   MAMMOGRAM  05/04/2024   Medicare Annual Wellness (AWV)  10/30/2024   DEXA SCAN  01/27/2025   Colonoscopy  11/17/2026   Pneumonia Vaccine 37+ Years old  Completed   Hepatitis C Screening  Completed   Zoster Vaccines- Shingrix  Completed   HPV VACCINES  Aged Out   Meningococcal B Vaccine  Aged Out    Health Maintenance  Health Maintenance Due  Topic Date Due   DTaP/Tdap/Td (1 - Tdap) Never done   COVID-19 Vaccine (7 - Pfizer risk 2024-25 season) 10/04/2023   Health Maintenance Items Addressed: Vaccines updated per [facility]  Additional Screening:  Vision Screening: Recommended annual ophthalmology exams for early detection of glaucoma and other disorders of the eye. Would you like a referral to an eye doctor? No    Dental Screening: Recommended annual dental exams for proper oral hygiene  Community Resource Referral / Chronic Care Management: CRR required this visit?  No   CCM required this visit?  No   Plan:    I have personally reviewed and noted the following in the patient's chart:   Medical and social history Use of alcohol, tobacco or illicit drugs  Current medications and supplements including opioid prescriptions. Patient is not currently taking opioid prescriptions. Functional ability and status Nutritional status Physical activity Advanced directives List of other physicians Hospitalizations, surgeries, and ER visits in previous 12 months Vitals Screenings to include cognitive, depression, and  falls Referrals and appointments  In addition, I have reviewed and discussed with patient certain preventive protocols, quality metrics, and best practice recommendations. A written personalized care plan for preventive services as well as general preventive health recommendations were provided to patient.   Dilyn Osoria, CMA   10/31/2023   After Visit Summary: (MyChart) Due to this being a telephonic visit, the after visit summary with patients personalized plan was offered to patient via MyChart   Notes: Nothing significant to report at this time.9, 2026 at

## 2023-10-31 NOTE — Patient Instructions (Signed)
 Ms. Hedgecock , Thank you for taking time out of your busy schedule to complete your Annual Wellness Visit with me. I enjoyed our conversation and look forward to speaking with you again next year. I, as well as your care team,  appreciate your ongoing commitment to your health goals. Please review the following plan we discussed and let me know if I can assist you in the future. Your Game plan/ To Do List    Referrals: If you haven't heard from the office you've been referred to, please reach out to them at the phone number provided.  N/a Follow up Visits: Next Medicare AWV with our clinical staff:   November 06, 2024 at 8:50 am telephone visit Have you seen your provider in the last 6 months (3 months if uncontrolled diabetes)? Yes Next Office Visit with your provider:   November 03, 2024 at 9:40 am   Clinician Recommendations:   Aim for 30 minutes of exercise or brisk walking, 6-8 glasses of water, and 5 servings of fruits and vegetables each day.  I enjoyed our conversation today and look forward to talking with you again next year!! Have a wonderful and safe year. All the best, Jamilett Ferrante This is a list of the screening recommended for you and due dates:  Health Maintenance  Topic Date Due   DTaP/Tdap/Td vaccine (1 - Tdap) Never done   COVID-19 Vaccine (7 - Pfizer risk 2024-25 season) 10/04/2023   Flu Shot  12/30/2023   Mammogram  05/04/2024   Medicare Annual Wellness Visit  10/30/2024   DEXA scan (bone density measurement)  01/27/2025   Colon Cancer Screening  11/17/2026   Pneumonia Vaccine  Completed   Hepatitis C Screening  Completed   Zoster (Shingles) Vaccine  Completed   HPV Vaccine  Aged Out   Meningitis B Vaccine  Aged Out    Advanced directives: (Provided) Advance directive discussed with you today. I have provided a copy for you to complete at home and have notarized. Once this is complete, please bring a copy in to our office so we can scan it into your chart.  Advance Care Planning is  important because it:  [x]  Makes sure you receive the medical care that is consistent with your values, goals, and preferences  [x]  It provides guidance to your family and loved ones and reduces their decisional burden about whether or not they are making the right decisions based on your wishes.  Follow the link provided in your after visit summary or read over the paperwork we have mailed to you to help you started getting your Advance Directives in place. If you need assistance in completing these, please reach out to us  so that we can help you! See attachments for Preventive Care and Fall Prevention Tips.  Understanding Your Risk for Falls Millions of people have serious injuries from falls each year. It is important to understand your risk of falling. Talk with your health care provider about your risk and what you can do to lower it. If you do have a serious fall, make sure to tell your provider. Falling once raises your risk of falling again. How can falls affect me? Serious injuries from falls are common. These include: Broken bones, such as hip fractures. Head injuries, such as traumatic brain injuries (TBI) or concussions. A fear of falling can cause you to avoid activities and stay at home. This can make your muscles weaker and raise your risk for a fall. What can increase my  risk? There are a number of risk factors that increase your risk for falling. The more risk factors you have, the higher your risk of falling. Serious injuries from a fall happen most often to people who are older than 75 years old. Teenagers and young adults ages 30-29 are also at higher risk. Common risk factors include: Weakness in the lower body. Being generally weak or confused due to long-term (chronic) illness. Dizziness or balance problems. Poor vision. Medicines that cause dizziness or drowsiness. These may include: Medicines for your blood pressure, heart, anxiety, insomnia, or swelling  (edema). Pain medicines. Muscle relaxants. Other risk factors include: Drinking alcohol. Having had a fall in the past. Having foot pain or wearing improper footwear. Working at a dangerous job. Having any of the following in your home: Tripping hazards, such as floor clutter or loose rugs. Poor lighting. Pets. Having dementia or memory loss. What actions can I take to lower my risk of falling?     Physical activity Stay physically fit. Do strength and balance exercises. Consider taking a regular class to build strength and balance. Yoga and tai chi are good options. Vision Have your eyes checked every year and your prescription for glasses or contacts updated as needed. Shoes and walking aids Wear non-skid shoes. Wear shoes that have rubber soles and low heels. Do not wear high heels. Do not walk around the house in socks or slippers. Use a cane or walker as told by your provider. Home safety Attach secure railings on both sides of your stairs. Install grab bars for your bathtub, shower, and toilet. Use a non-skid mat in your bathtub or shower. Attach bath mats securely with double-sided, non-slip rug tape. Use good lighting in all rooms. Keep a flashlight near your bed. Make sure there is a clear path from your bed to the bathroom. Use night-lights. Do not use throw rugs. Make sure all carpeting is taped or tacked down securely. Remove all clutter from walkways and stairways, including extension cords. Repair uneven or broken steps and floors. Avoid walking on icy or slippery surfaces. Walk on the grass instead of on icy or slick sidewalks. Use ice melter to get rid of ice on walkways in the winter. Use a cordless phone. Questions to ask your health care provider Can you help me check my risk for a fall? Do any of my medicines make me more likely to fall? Should I take a vitamin D supplement? What exercises can I do to improve my strength and balance? Should I make an  appointment to have my vision checked? Do I need a bone density test to check for weak bones (osteoporosis)? Would it help to use a cane or a walker? Where to find more information Centers for Disease Control and Prevention, STEADI: TonerPromos.no Community-Based Fall Prevention Programs: TonerPromos.no [employer] on Aging: BaseRingTones.pl Contact a health care provider if: You fall at home. You are afraid of falling at home. You feel weak, drowsy, or dizzy. This information is not intended to replace advice given to you by your health care provider. Make sure you discuss any questions you have with your health care provider. Document Revised: 01/18/2022 Document Reviewed: 01/18/2022 Elsevier Patient Education  2024 [employer].  Understanding Your Risk for Falls Millions of people have serious injuries from falls each year. It is important to understand your risk of falling. Talk with your health care provider about your risk and what you can do to lower it. If you do have  a serious fall, make sure to tell your provider. Falling once raises your risk of falling again. How can falls affect me? Serious injuries from falls are common. These include: Broken bones, such as hip fractures. Head injuries, such as traumatic brain injuries (TBI) or concussions. A fear of falling can cause you to avoid activities and stay at home. This can make your muscles weaker and raise your risk for a fall. What can increase my risk? There are a number of risk factors that increase your risk for falling. The more risk factors you have, the higher your risk of falling. Serious injuries from a fall happen most often to people who are older than 76 years old. Teenagers and young adults ages 73-29 are also at higher risk. Common risk factors include: Weakness in the lower body. Being generally weak or confused due to long-term (chronic) illness. Dizziness or balance problems. Poor vision. Medicines that cause dizziness  or drowsiness. These may include: Medicines for your blood pressure, heart, anxiety, insomnia, or swelling (edema). Pain medicines. Muscle relaxants. Other risk factors include: Drinking alcohol. Having had a fall in the past. Having foot pain or wearing improper footwear. Working at a dangerous job. Having any of the following in your home: Tripping hazards, such as floor clutter or loose rugs. Poor lighting. Pets. Having dementia or memory loss. What actions can I take to lower my risk of falling?     Physical activity Stay physically fit. Do strength and balance exercises. Consider taking a regular class to build strength and balance. Yoga and tai chi are good options. Vision Have your eyes checked every year and your prescription for glasses or contacts updated as needed. Shoes and walking aids Wear non-skid shoes. Wear shoes that have rubber soles and low heels. Do not wear high heels. Do not walk around the house in socks or slippers. Use a cane or walker as told by your provider. Home safety Attach secure railings on both sides of your stairs. Install grab bars for your bathtub, shower, and toilet. Use a non-skid mat in your bathtub or shower. Attach bath mats securely with double-sided, non-slip rug tape. Use good lighting in all rooms. Keep a flashlight near your bed. Make sure there is a clear path from your bed to the bathroom. Use night-lights. Do not use throw rugs. Make sure all carpeting is taped or tacked down securely. Remove all clutter from walkways and stairways, including extension cords. Repair uneven or broken steps and floors. Avoid walking on icy or slippery surfaces. Walk on the grass instead of on icy or slick sidewalks. Use ice melter to get rid of ice on walkways in the winter. Use a cordless phone. Questions to ask your health care provider Can you help me check my risk for a fall? Do any of my medicines make me more likely to fall? Should I take  a vitamin D supplement? What exercises can I do to improve my strength and balance? Should I make an appointment to have my vision checked? Do I need a bone density test to check for weak bones (osteoporosis)? Would it help to use a cane or a walker? Where to find more information Centers for Disease Control and Prevention, STEADI: TonerPromos.no Community-Based Fall Prevention Programs: TonerPromos.no [employer] on Aging: BaseRingTones.pl Contact a health care provider if: You fall at home. You are afraid of falling at home. You feel weak, drowsy, or dizzy. This information is not intended to replace advice given to you  by your health care provider. Make sure you discuss any questions you have with your health care provider. Document Revised: 01/18/2022 Document Reviewed: 01/18/2022 Elsevier Patient Education  2024 [employer].

## 2023-11-04 ENCOUNTER — Ambulatory Visit (INDEPENDENT_AMBULATORY_CARE_PROVIDER_SITE_OTHER): Payer: Medicare Other | Admitting: Family Medicine

## 2023-11-04 ENCOUNTER — Other Ambulatory Visit: Payer: Self-pay | Admitting: Family Medicine

## 2023-11-04 ENCOUNTER — Encounter: Payer: Self-pay | Admitting: Family Medicine

## 2023-11-04 VITALS — BP 113/69 | HR 65 | Resp 14 | Ht 64.0 in | Wt 114.4 lb

## 2023-11-04 DIAGNOSIS — Z0000 Encounter for general adult medical examination without abnormal findings: Secondary | ICD-10-CM

## 2023-11-04 DIAGNOSIS — E7841 Elevated Lipoprotein(a): Secondary | ICD-10-CM | POA: Diagnosis not present

## 2023-11-04 DIAGNOSIS — Z130 Encounter for screening for diseases of the blood and blood-forming organs and certain disorders involving the immune mechanism: Secondary | ICD-10-CM | POA: Diagnosis not present

## 2023-11-04 DIAGNOSIS — J302 Other seasonal allergic rhinitis: Secondary | ICD-10-CM | POA: Diagnosis not present

## 2023-11-04 DIAGNOSIS — Z13228 Encounter for screening for other metabolic disorders: Secondary | ICD-10-CM | POA: Diagnosis not present

## 2023-11-04 DIAGNOSIS — Z1329 Encounter for screening for other suspected endocrine disorder: Secondary | ICD-10-CM | POA: Diagnosis not present

## 2023-11-04 MED ORDER — ROSUVASTATIN CALCIUM 10 MG PO TABS: 10.0000 mg | ORAL_TABLET | Freq: Every day | ORAL | 3 refills | Status: DC

## 2023-11-04 NOTE — Progress Notes (Addendum)
 Complete physical exam   Patient: Amanda Love   DOB: [birth year]   73 y.o. Female  MRN: [id number] Visit Date: 11/04/2023  Today's healthcare provider: LAURAINE LOISE BUOY, DO   Chief Complaint  Patient presents with   Headache    Did not need her anxiety medication. Exercising daily. Sleeps about 7-8 hours. No concern. Pt is fasting   Subjective    Amanda Love is a 73 y.o. female who presents today for a complete physical exam.  She reports consuming a general diet. Exercises daily. She generally feels well. She reports sleeping well. She does not have additional problems to discuss today.   HPI HPI     Headache    Additional comments: Did not need her anxiety medication. Exercising daily. Sleeps about 7-8 hours. No concern. Pt is fasting      Last edited by BUOY LAURAINE LOISE, DO on 03/09/2024  5:13 PM.      Amanda Love is a 73 year old female who presents for a follow up exam and evaluation of pressure in her head.  She experiences intermittent pressure on the left side of her head, similar to the sensation of being in an airplane, for the past couple of weeks. This pressure sometimes resolves spontaneously, as it did this morning. She describes a sensation of something draining out when lying down. She has not taken any medication for allergies and has not had significant allergy problems in the past.  She denies taking any medication for anxiety, specifically mentioning that she is not taking escitalopram. She feels she is managing well without it.  No history of anemia.  She received a pneumonia shot last year and is unsure about her hepatitis B vaccination status.  She maintains an active lifestyle, walking frequently and using a treadmill. She watches her diet carefully, avoiding overindulgence, and does not consume alcohol or smoke.  She is currently taking cholesterol medication and is due for a refill soon. She has a history of cholesterol issues but no history of  diabetes.     Past Medical History:  Diagnosis Date   Age related osteoporosis 02/08/2023   Hyperlipidemia    Past Surgical History:  Procedure Laterality Date   COLONOSCOPY WITH PROPOFOL      COLONOSCOPY WITH PROPOFOL  N/A 11/16/2021   Procedure: COLONOSCOPY WITH PROPOFOL ;  Surgeon: Unk Corinn Skiff, MD;  Location: [facility] ENDOSCOPY;  Service: Gastroenterology;  Laterality: N/A;   EVALUATION UNDER ANESTHESIA WITH HEMORRHOIDECTOMY AND PROCTOSCOPY     Social History   Socioeconomic History   Marital status: Married    Spouse name: Not on file   Number of children: 1   Years of education: Not on file   Highest education level: 12th grade  Occupational History   Not on file  Tobacco Use   Smoking status: Never   Smokeless tobacco: Never  Vaping Use   Vaping status: Never Used  Substance and Sexual Activity   Alcohol use: Never   Drug use: Never   Sexual activity: Not Currently    Birth control/protection: None  Other Topics Concern   Not on file  Social History Narrative   Not on file   Social Drivers of Health   Financial Resource Strain: Low Risk  (10/31/2023)   Overall Financial Resource Strain (CARDIA)    Difficulty of Paying Living Expenses: Not hard at all  Food Insecurity: No Food Insecurity (10/31/2023)   Hunger Vital Sign    Worried About Running Out of Food  in the Last Year: Never true    Ran Out of Food in the Last Year: Never true  Transportation Needs: No Transportation Needs (10/31/2023)   PRAPARE - Administrator, Civil Service (Medical): No    Lack of Transportation (Non-Medical): No  Physical Activity: Sufficiently Active (10/31/2023)   Exercise Vital Sign    Days of Exercise per Week: 7 days    Minutes of Exercise per Session: 30 min  Stress: No Stress Concern Present (10/31/2023)   [employer] of Occupational Health - Occupational Stress Questionnaire    Feeling of Stress : Not at all  Social Connections: Socially Integrated (10/31/2023)    Social Connection and Isolation Panel    Frequency of Communication with Friends and Family: More than three times a week    Frequency of Social Gatherings with Friends and Family: More than three times a week    Attends Religious Services: More than 4 times per year    Active Member of Clubs or Organizations: Yes    Attends Banker Meetings: More than 4 times per year    Marital Status: Married  Catering manager Violence: Not At Risk (10/31/2023)   Humiliation, Afraid, Rape, and Kick questionnaire    Fear of Current or Ex-Partner: No    Emotionally Abused: No    Physically Abused: No    Sexually Abused: No   Family Status  Relation Name Status   Mother  Deceased   Father  Deceased   Sister  Alive   Brother  Alive   Brother  Alive   Brother  Alive   Neg Hx  (Not Specified)  No partnership data on file   Family History  Problem Relation Age of Onset   Breast cancer Neg Hx    No Known Allergies  Patient Care Team: Eurydice Calixto, Lauraine SAILOR, DO as PCP - General (Family Medicine) Carolee Manus DASEN., MD (Ophthalmology) Brendia Calton Squires, PA-C (Endocrinology) Milissa Hamming, MD as Referring Physician (Otolaryngology) Effie Evalene PARAS, DC as Referring Physician (Chiropractic Medicine)   Medications: Outpatient Medications Prior to Visit  Medication Sig   aspirin 325 MG tablet Take 325 mg by mouth daily.   Calcium  Carbonate-Vit D-Min (CALTRATE 600+D PLUS PO) Take by mouth daily.    CALCIUM  CARBONATE-VITAMIN D PO Take by mouth.   Cholecalciferol (VITAMIN D3 MAXIMUM STRENGTH) 125 MCG (5000 UT) capsule Take 5,000 Units by mouth daily.   Coenzyme Q10 10 MG capsule Take 300 mg by mouth daily.    CYANOCOBALAMIN PO Take by mouth daily.   Misc Natural Products (OSTEO BI-FLEX ADV TRIPLE ST PO) Take by mouth 2 (two) times daily.    Multiple Vitamin (MULTIVITAMIN) tablet Take 1 tablet by mouth daily.   SUPER B COMPLEX/C PO Take by mouth daily.   [DISCONTINUED]  rosuvastatin  (CRESTOR ) 10 MG tablet TAKE 1 TABLET (10 MG TOTAL) BY MOUTH DAILY. TAKE 10 MG BY MOUTH ON THREE TIMES A WEEK. TAKE 5 MG ALL OTHER DAYS OF THE WEEK.   No facility-administered medications prior to visit.    Review of Systems  Constitutional:  Negative for chills, fatigue and fever.  HENT:  Negative for congestion, ear pain, rhinorrhea, sneezing and sore throat.   Eyes: Negative.  Negative for pain and redness.  Respiratory:  Negative for cough, shortness of breath and wheezing.   Cardiovascular:  Negative for chest pain and leg swelling.  Gastrointestinal:  Negative for abdominal pain, blood in stool, constipation, diarrhea and nausea.  Endocrine: Negative  for polydipsia and polyphagia.  Genitourinary: Negative.  Negative for dysuria, flank pain, hematuria, pelvic pain, vaginal bleeding and vaginal discharge.  Musculoskeletal:  Negative for arthralgias, back pain, gait problem and joint swelling.  Skin:  Negative for rash.  Neurological: Negative.  Negative for dizziness, tremors, seizures, weakness, light-headedness, numbness and headaches.  Hematological:  Negative for adenopathy.  Psychiatric/Behavioral: Negative.  Negative for behavioral problems, confusion and dysphoric mood. The patient is not nervous/anxious and is not hyperactive.       Objective    BP 113/69 (BP Location: Right Arm, Patient Position: Sitting, Cuff Size: Normal)   Pulse 65   Resp 14   Ht 5' 4 (1.626 m)   Wt 114 lb 6.4 oz (51.9 kg)   SpO2 99%   BMI 19.64 kg/m    Physical Exam Vitals and nursing note reviewed.  Constitutional:      General: She is awake.     Appearance: Normal appearance.  HENT:     Head: Normocephalic and atraumatic.     Right Ear: Tympanic membrane, ear canal and external ear normal.     Left Ear: Tympanic membrane, ear canal and external ear normal.     Nose: Nose normal.     Mouth/Throat:     Mouth: Mucous membranes are moist.     Pharynx: Oropharynx is clear. No  oropharyngeal exudate or posterior oropharyngeal erythema.  Eyes:     General: No scleral icterus.    Extraocular Movements: Extraocular movements intact.     Conjunctiva/sclera: Conjunctivae normal.     Pupils: Pupils are equal, round, and reactive to light.  Neck:     Thyroid : No thyromegaly or thyroid  tenderness.  Cardiovascular:     Rate and Rhythm: Normal rate and regular rhythm.     Pulses: Normal pulses.     Heart sounds: Normal heart sounds.  Pulmonary:     Effort: Pulmonary effort is normal. No tachypnea, bradypnea or respiratory distress.     Breath sounds: Normal breath sounds. No stridor. No wheezing, rhonchi or rales.  Abdominal:     General: Bowel sounds are normal. There is no distension.     Palpations: Abdomen is soft. There is no mass.     Tenderness: There is no abdominal tenderness. There is no guarding.     Hernia: No hernia is present.  Musculoskeletal:     Cervical back: Normal range of motion and neck supple.     Right lower leg: No edema.     Left lower leg: No edema.  Lymphadenopathy:     Cervical: No cervical adenopathy.  Skin:    General: Skin is warm and dry.  Neurological:     Mental Status: She is alert and oriented to person, place, and time. Mental status is at baseline.  Psychiatric:        Mood and Affect: Mood normal.        Behavior: Behavior normal.      Last depression screening scores    11/04/2023    9:42 AM 10/31/2023   11:38 AM 07/27/2023   10:45 AM  PHQ 2/9 Scores  PHQ - 2 Score 0 0 0  PHQ- 9 Score 0 0 0   Last fall risk screening    11/04/2023    9:42 AM  Fall Risk   Falls in the past year? 0  Number falls in past yr: 0  Injury with Fall? 0  Risk for fall due to : No Fall Risks  Last Audit-C alcohol use screening    10/31/2023   11:39 AM  Alcohol Use Disorder Test (AUDIT)  1. How often do you have a drink containing alcohol? 0  2. How many drinks containing alcohol do you have on a typical day when you are drinking? 0   3. How often do you have six or more drinks on one occasion? 0  AUDIT-C Score 0   A score of 3 or more in women, and 4 or more in men indicates increased risk for alcohol abuse, EXCEPT if all of the points are from question 1   Results for orders placed or performed in visit on 11/04/23  Comprehensive metabolic panel with GFR  Result Value Ref Range   Glucose 94 70 - 99 mg/dL   BUN 16 8 - 27 mg/dL   Creatinine, Ser 9.43 (L) 0.57 - 1.00 mg/dL   eGFR 96 >40 fO/fpw/8.26   BUN/Creatinine Ratio 29 (H) 12 - 28   Sodium 144 134 - 144 mmol/L   Potassium 4.5 3.5 - 5.2 mmol/L   Chloride 105 96 - 106 mmol/L   CO2 25 20 - 29 mmol/L   Calcium  9.4 8.7 - 10.3 mg/dL   Total Protein 7.1 6.0 - 8.5 g/dL   Albumin 4.7 3.8 - 4.8 g/dL   Globulin, Total 2.4 1.5 - 4.5 g/dL   Bilirubin Total 0.5 0.0 - 1.2 mg/dL   Alkaline Phosphatase 55 44 - 121 IU/L   AST 37 0 - 40 IU/L   ALT 45 (H) 0 - 32 IU/L  Lipid panel  Result Value Ref Range   Cholesterol, Total 195 100 - 199 mg/dL   Triglycerides 55 0 - 149 mg/dL   HDL 62 >60 mg/dL   VLDL Cholesterol Cal 10 5 - 40 mg/dL   LDL Chol Calc (NIH) 876 (H) 0 - 99 mg/dL   Chol/HDL Ratio 3.1 0.0 - 4.4 ratio    Assessment & Plan    Routine Health Maintenance and Physical Exam  Exercise Activities and Dietary recommendations  Goals      DIET - EAT MORE FRUITS AND VEGETABLES     DIET - INCREASE WATER INTAKE     Recommend to drink at least 6-8 8oz glasses of water per day.     Patient Stated     I want to get my new house all in order and do more bible studying        Immunization History  Administered Date(s) Administered   Fluad Quad(high Dose 65+) 04/18/2020, 03/29/2022   INFLUENZA, HIGH DOSE SEASONAL PF 03/28/2018, 02/16/2019, 03/19/2021, 04/06/2023   Influenza-Unspecified 04/06/2017, 03/29/2022   PFIZER Comirnaty(Gray Top)Covid-19 Tri-Sucrose Vaccine 09/25/2020   PFIZER(Purple Top)SARS-COV-2 Vaccination 06/21/2019, 07/12/2019, 02/28/2020    PNEUMOCOCCAL CONJUGATE-20 04/06/2023   Pfizer Covid-19 Vaccine Bivalent Booster 61yrs & up 03/19/2021   Pfizer(Comirnaty)Fall Seasonal Vaccine 12 years and older 03/29/2022, 04/06/2023   Pneumococcal Conjugate-13 03/28/2018   Pneumococcal Polysaccharide-23 06/05/2020   RSV,unspecified 06/22/2022   Respiratory Syncytial Virus Vaccine,Recomb Aduvanted(Arexvy) 06/22/2022   Zoster Recombinant(Shingrix) 06/22/2022, 09/01/2022    Health Maintenance  Topic Date Due   DTaP/Tdap/Td (1 - Tdap) Never done   Influenza Vaccine  12/30/2023   COVID-19 Vaccine (8 - 2025-26 season) 01/30/2024   Mammogram  05/04/2024   Medicare Annual Wellness (AWV)  10/30/2024   DEXA SCAN  01/27/2025   Colonoscopy  11/17/2026   Pneumococcal Vaccine: 50+ Years  Completed   Hepatitis C Screening  Completed   Zoster Vaccines- Shingrix  Completed   Meningococcal B Vaccine  Aged Out    Discussed health benefits of physical activity, and encouraged her to engage in regular exercise appropriate for her age and condition.   Seasonal allergies  Elevated lipoprotein(a) -     Lipid panel  Screening for endocrine, metabolic and immunity disorder -     Comprehensive metabolic panel with GFR      Seasonal allergies Intermittent ear pressure and postnasal drip likely allergy-related with fluctuating symptoms. No current allergy medication use. - Initiate Claritin (loratadine) nightly as needed. Consider Zyrtec (cetirizine) or Allegra (fexofenadine) if symptoms persist. Consider Xyzal (levocetirizine) if Zyrtec is partially effective.    Routine health maintenance; elevated lipoprotein(a); screening for endocrine, metabolic and immunity disorder Physical exam overall unremarkable except as noted above. Routine lab work ordered as noted. Vaccinations and screenings discussed. Up to date with pneumonia vaccination. Tdap and RSV vaccines recommended. COVID-19 booster declined; patient did receive this season's dose 1 time  already and is not interested in a second dose. - Recommend Tdap vaccine if not received in the last 10 years, to be obtained at the pharmacy. - Recommend RSV vaccine, available at the pharmacy.    Return in about 1 year (around 11/03/2024) for Chronic f/u.     I discussed the assessment and treatment plan with the patient  The patient was provided an opportunity to ask questions and all were answered. The patient agreed with the plan and demonstrated an understanding of the instructions.   The patient was advised to call back or seek an in-person evaluation if the symptoms worsen or if the condition fails to improve as anticipated.    LAURAINE LOISE BUOY, DO  [facility] [phone] (phone) [phone] (fax)  [facility]

## 2023-11-04 NOTE — Patient Instructions (Addendum)
 Recommended vaccine:  - Tdap (tetanus, diphtheria and pertussis (whooping cough)) - RSV (respiratory syncytial virus) vaccine     For allergies you can start with taking Claritin (loratadine) nightly.  This can be as needed for every night depending on your symptoms.  If this is not helping, consider switching to Zyrtec (cetirizine) or Allegra (fexofenadine).  If you take Zyrtec and it works partially if you are still having problems, you can switch to Xyzal (levocetirizine).

## 2023-11-04 NOTE — Telephone Encounter (Signed)
 Requested medications are due for refill today.  No    Requested medications are on the active medications list.  yes  Last refill. today  Future visit scheduled.   yes  Notes to clinic.  Pharmacy comment: Script Clarification:PLEASE SPECIFY SIG.    Requested Prescriptions  Pending Prescriptions Disp Refills   rosuvastatin  (CRESTOR ) 10 MG tablet [Pharmacy Med Name: ROSUVASTATIN  CALCIUM  10 MG TAB] 90 tablet 3    Sig: Take 1 tablet (10 mg total) by mouth daily. Take 10 mg by mouth on three times a week. Take 5 mg all other days of the week.     Cardiovascular:  Antilipid - Statins 2 Failed - 11/04/2023  5:42 PM      Failed - Cr in normal range and within 360 days    Creatinine, Ser  Date Value Ref Range Status  11/03/2022 0.59 0.57 - 1.00 mg/dL Final         Failed - Lipid Panel in normal range within the last 12 months    Cholesterol, Total  Date Value Ref Range Status  11/03/2022 177 100 - 199 mg/dL Final   LDL Chol Calc (NIH)  Date Value Ref Range Status  11/03/2022 101 (H) 0 - 99 mg/dL Final   HDL  Date Value Ref Range Status  11/03/2022 66 >39 mg/dL Final   Triglycerides  Date Value Ref Range Status  11/03/2022 48 0 - 149 mg/dL Final         Passed - Patient is not pregnant      Passed - Valid encounter within last 12 months    Recent Outpatient Visits           Today Annual physical exam   [facility] [place], Sarah N, DO   3 months ago Follow-up exam, less than 3 months since previous exam   [facility] [place], Asencion Blacksmith, DO

## 2023-11-05 LAB — LIPID PANEL
Chol/HDL Ratio: 3.1 ratio (ref 0.0–4.4)
Cholesterol, Total: 195 mg/dL (ref 100–199)
HDL: 62 mg/dL (ref >39)
LDL Chol Calc (NIH): 123 mg/dL — ABNORMAL HIGH (ref 0–99)
Triglycerides: 55 mg/dL (ref 0–149)
VLDL Cholesterol Cal: 10 mg/dL (ref 5–40)

## 2023-11-05 LAB — COMPREHENSIVE METABOLIC PANEL WITH GFR
ALT: 45 IU/L — ABNORMAL HIGH (ref 0–32)
AST: 37 IU/L (ref 0–40)
Albumin: 4.7 g/dL (ref 3.8–4.8)
Alkaline Phosphatase: 55 IU/L (ref 44–121)
BUN/Creatinine Ratio: 29 — ABNORMAL HIGH (ref 12–28)
BUN: 16 mg/dL (ref 8–27)
Bilirubin Total: 0.5 mg/dL (ref 0.0–1.2)
CO2: 25 mmol/L (ref 20–29)
Calcium: 9.4 mg/dL (ref 8.7–10.3)
Chloride: 105 mmol/L (ref 96–106)
Creatinine, Ser: 0.56 mg/dL — ABNORMAL LOW (ref 0.57–1.00)
Globulin, Total: 2.4 g/dL (ref 1.5–4.5)
Glucose: 94 mg/dL (ref 70–99)
Potassium: 4.5 mmol/L (ref 3.5–5.2)
Sodium: 144 mmol/L (ref 134–144)
Total Protein: 7.1 g/dL (ref 6.0–8.5)
eGFR: 96 mL/min/{1.73_m2} (ref >59)

## 2023-11-09 ENCOUNTER — Encounter: Payer: Self-pay | Admitting: Family Medicine

## 2023-11-10 ENCOUNTER — Ambulatory Visit: Payer: Self-pay | Admitting: Family Medicine

## 2023-11-10 DIAGNOSIS — E7841 Elevated Lipoprotein(a): Secondary | ICD-10-CM

## 2023-11-10 MED ORDER — ROSUVASTATIN CALCIUM 10 MG PO TABS: 10.0000 mg | ORAL_TABLET | Freq: Every day | ORAL | 3 refills | Status: DC

## 2023-11-14 MED ORDER — ROSUVASTATIN CALCIUM 10 MG PO TABS: 10.0000 mg | ORAL_TABLET | Freq: Every day | ORAL | 3 refills | Status: AC

## 2023-12-05 DIAGNOSIS — M9901 Segmental and somatic dysfunction of cervical region: Secondary | ICD-10-CM | POA: Diagnosis not present

## 2023-12-05 DIAGNOSIS — M5033 Other cervical disc degeneration, cervicothoracic region: Secondary | ICD-10-CM | POA: Diagnosis not present

## 2023-12-05 DIAGNOSIS — M9902 Segmental and somatic dysfunction of thoracic region: Secondary | ICD-10-CM | POA: Diagnosis not present

## 2023-12-05 DIAGNOSIS — M62830 Muscle spasm of back: Secondary | ICD-10-CM | POA: Diagnosis not present

## 2023-12-26 DIAGNOSIS — K219 Gastro-esophageal reflux disease without esophagitis: Secondary | ICD-10-CM | POA: Diagnosis not present

## 2023-12-26 DIAGNOSIS — H6121 Impacted cerumen, right ear: Secondary | ICD-10-CM | POA: Diagnosis not present

## 2024-01-02 DIAGNOSIS — M9901 Segmental and somatic dysfunction of cervical region: Secondary | ICD-10-CM | POA: Diagnosis not present

## 2024-01-02 DIAGNOSIS — M9902 Segmental and somatic dysfunction of thoracic region: Secondary | ICD-10-CM | POA: Diagnosis not present

## 2024-01-02 DIAGNOSIS — M5033 Other cervical disc degeneration, cervicothoracic region: Secondary | ICD-10-CM | POA: Diagnosis not present

## 2024-01-02 DIAGNOSIS — M62830 Muscle spasm of back: Secondary | ICD-10-CM | POA: Diagnosis not present

## 2024-02-06 DIAGNOSIS — M9902 Segmental and somatic dysfunction of thoracic region: Secondary | ICD-10-CM | POA: Diagnosis not present

## 2024-02-06 DIAGNOSIS — M5033 Other cervical disc degeneration, cervicothoracic region: Secondary | ICD-10-CM | POA: Diagnosis not present

## 2024-02-06 DIAGNOSIS — M9901 Segmental and somatic dysfunction of cervical region: Secondary | ICD-10-CM | POA: Diagnosis not present

## 2024-02-06 DIAGNOSIS — M62830 Muscle spasm of back: Secondary | ICD-10-CM | POA: Diagnosis not present

## 2024-02-29 DIAGNOSIS — M810 Age-related osteoporosis without current pathological fracture: Secondary | ICD-10-CM | POA: Diagnosis not present

## 2024-03-05 DIAGNOSIS — M9901 Segmental and somatic dysfunction of cervical region: Secondary | ICD-10-CM | POA: Diagnosis not present

## 2024-03-05 DIAGNOSIS — M5033 Other cervical disc degeneration, cervicothoracic region: Secondary | ICD-10-CM | POA: Diagnosis not present

## 2024-03-05 DIAGNOSIS — M9902 Segmental and somatic dysfunction of thoracic region: Secondary | ICD-10-CM | POA: Diagnosis not present

## 2024-03-05 DIAGNOSIS — M62830 Muscle spasm of back: Secondary | ICD-10-CM | POA: Diagnosis not present

## 2024-03-07 DIAGNOSIS — M810 Age-related osteoporosis without current pathological fracture: Secondary | ICD-10-CM | POA: Diagnosis not present

## 2024-03-09 NOTE — Addendum Note (Signed)
 Addended by: DONZELLA DOMINO on: 03/09/2024 05:13 PM   Modules accepted: Level of Service

## 2024-03-22 DIAGNOSIS — Z961 Presence of intraocular lens: Secondary | ICD-10-CM | POA: Diagnosis not present

## 2024-03-28 ENCOUNTER — Ambulatory Visit (INDEPENDENT_AMBULATORY_CARE_PROVIDER_SITE_OTHER): Admitting: Dermatology

## 2024-03-28 DIAGNOSIS — Z7189 Other specified counseling: Secondary | ICD-10-CM

## 2024-03-28 DIAGNOSIS — L820 Inflamed seborrheic keratosis: Secondary | ICD-10-CM | POA: Diagnosis not present

## 2024-03-28 DIAGNOSIS — H019 Unspecified inflammation of eyelid: Secondary | ICD-10-CM

## 2024-03-28 DIAGNOSIS — Z79899 Other long term (current) drug therapy: Secondary | ICD-10-CM

## 2024-03-28 DIAGNOSIS — H01119 Allergic dermatitis of unspecified eye, unspecified eyelid: Secondary | ICD-10-CM

## 2024-03-28 MED ORDER — PIMECROLIMUS 1 % EX CREA: TOPICAL_CREAM | Freq: Two times a day (BID) | CUTANEOUS | 0 refills | Status: AC

## 2024-03-28 NOTE — Progress Notes (Signed)
   Follow-Up Visit   Subjective  Amanda Love is a 73 y.o. female who presents for the following: very dry at eyelids x 3 weeks, no visible rash. Patient has been using same cosmetic cream for many years. A few itchy spots at sides of face that may have been treated in the past.   The following portions of the chart were reviewed this encounter and updated as appropriate: medications, allergies, medical history  Review of Systems:  No other skin or systemic complaints except as noted in HPI or Assessment and Plan.  Objective  Well appearing patient in no apparent distress; mood and affect are within normal limits.   A focused examination was performed of the following areas: face  Relevant exam findings are noted in the Assessment and Plan.  L temple, L cheek and, L preauricular x 20, R temple, R cheek, R preauricular x 12 (32) Erythematous stuck-on, waxy papule or plaque  Assessment & Plan   EYELID DERMATITIS, POSSIBLE CONTACT Exam: xerosis  Treatment Plan: Start pimecrolimus twice daily to aa at eyelids prn. If not improving, recommend patch testing.    INFLAMED SEBORRHEIC KERATOSIS (32) L temple, L cheek and, L preauricular x 20, R temple, R cheek, R preauricular x 12 (32) Symptomatic, irritating, patient would like treated.  Benign-appearing.  Call clinic for new or changing lesions.   Destruction of lesion - L temple, L cheek and, L preauricular x 20, R temple, R cheek, R preauricular x 12 (32) Complexity: simple   Destruction method: cryotherapy   Informed consent: discussed and consent obtained   Timeout:  patient name, date of birth, surgical site, and procedure verified Lesion destroyed using liquid nitrogen: Yes   Region frozen until ice ball extended beyond lesion: Yes   Outcome: patient tolerated procedure well with no complications   Post-procedure details: wound care instructions given     Return if symptoms worsen or fail to improve.  LILLETTE Lonell Drones,  RMA, am acting as scribe for Alm Rhyme, MD .   Documentation: I have reviewed the above documentation for accuracy and completeness, and I agree with the above.  Alm Rhyme, MD

## 2024-03-28 NOTE — Patient Instructions (Signed)

## 2024-03-29 DIAGNOSIS — M810 Age-related osteoporosis without current pathological fracture: Secondary | ICD-10-CM | POA: Diagnosis not present

## 2024-04-02 DIAGNOSIS — M62830 Muscle spasm of back: Secondary | ICD-10-CM | POA: Diagnosis not present

## 2024-04-02 DIAGNOSIS — M5033 Other cervical disc degeneration, cervicothoracic region: Secondary | ICD-10-CM | POA: Diagnosis not present

## 2024-04-02 DIAGNOSIS — M9901 Segmental and somatic dysfunction of cervical region: Secondary | ICD-10-CM | POA: Diagnosis not present

## 2024-04-02 DIAGNOSIS — Z23 Encounter for immunization: Secondary | ICD-10-CM | POA: Diagnosis not present

## 2024-04-02 DIAGNOSIS — M9902 Segmental and somatic dysfunction of thoracic region: Secondary | ICD-10-CM | POA: Diagnosis not present

## 2024-04-03 ENCOUNTER — Encounter: Payer: Self-pay | Admitting: Dermatology

## 2024-04-06 ENCOUNTER — Other Ambulatory Visit: Payer: Self-pay | Admitting: Family Medicine

## 2024-04-06 DIAGNOSIS — Z1231 Encounter for screening mammogram for malignant neoplasm of breast: Secondary | ICD-10-CM

## 2024-04-30 DIAGNOSIS — M9901 Segmental and somatic dysfunction of cervical region: Secondary | ICD-10-CM | POA: Diagnosis not present

## 2024-04-30 DIAGNOSIS — M5033 Other cervical disc degeneration, cervicothoracic region: Secondary | ICD-10-CM | POA: Diagnosis not present

## 2024-04-30 DIAGNOSIS — M62830 Muscle spasm of back: Secondary | ICD-10-CM | POA: Diagnosis not present

## 2024-04-30 DIAGNOSIS — M9902 Segmental and somatic dysfunction of thoracic region: Secondary | ICD-10-CM | POA: Diagnosis not present

## 2024-05-08 ENCOUNTER — Ambulatory Visit
Admission: RE | Admit: 2024-05-08 | Discharge: 2024-05-08 | Disposition: A | Source: Ambulatory Visit | Attending: Family Medicine | Admitting: Family Medicine

## 2024-05-08 DIAGNOSIS — Z1231 Encounter for screening mammogram for malignant neoplasm of breast: Secondary | ICD-10-CM | POA: Diagnosis not present

## 2024-05-21 ENCOUNTER — Ambulatory Visit: Payer: Self-pay | Admitting: Family Medicine

## 2024-11-06 ENCOUNTER — Ambulatory Visit
